# Patient Record
Sex: Female | Born: 1960 | Race: Black or African American | Hispanic: No | Marital: Single | Smoking: Former smoker
Health system: Southern US, Community
[De-identification: ages and names within clinical notes are randomized; demographics above are authoritative.]

## PROBLEM LIST (undated history)

## (undated) DIAGNOSIS — I10 Essential (primary) hypertension: Secondary | ICD-10-CM

## (undated) DIAGNOSIS — R569 Unspecified convulsions: Secondary | ICD-10-CM

## (undated) HISTORY — PX: GASTRIC BYPASS: SHX52

---

## 2009-11-23 DIAGNOSIS — R87619 Unspecified abnormal cytological findings in specimens from cervix uteri: Secondary | ICD-10-CM

## 2009-11-23 HISTORY — DX: Unspecified abnormal cytological findings in specimens from cervix uteri: R87.619

## 2011-12-02 DIAGNOSIS — K5790 Diverticulosis of intestine, part unspecified, without perforation or abscess without bleeding: Secondary | ICD-10-CM | POA: Insufficient documentation

## 2011-12-02 DIAGNOSIS — I8390 Asymptomatic varicose veins of unspecified lower extremity: Secondary | ICD-10-CM

## 2011-12-02 HISTORY — DX: Asymptomatic varicose veins of unspecified lower extremity: I83.90

## 2012-01-17 ENCOUNTER — Inpatient Hospital Stay (HOSPITAL_COMMUNITY): Payer: Self-pay

## 2012-01-17 ENCOUNTER — Observation Stay (HOSPITAL_COMMUNITY)
Admission: EM | Admit: 2012-01-17 | Discharge: 2012-01-18 | Disposition: A | Discharge status: Home / Self Care | Payer: Self-pay | Attending: Internal Medicine | Admitting: Internal Medicine

## 2012-01-17 ENCOUNTER — Encounter (HOSPITAL_COMMUNITY): Payer: Self-pay | Admitting: *Deleted

## 2012-01-17 ENCOUNTER — Emergency Department (HOSPITAL_COMMUNITY): Payer: Self-pay

## 2012-01-17 ENCOUNTER — Other Ambulatory Visit: Payer: Self-pay

## 2012-01-17 DIAGNOSIS — K219 Gastro-esophageal reflux disease without esophagitis: Secondary | ICD-10-CM | POA: Diagnosis present

## 2012-01-17 DIAGNOSIS — I10 Essential (primary) hypertension: Secondary | ICD-10-CM

## 2012-01-17 DIAGNOSIS — E785 Hyperlipidemia, unspecified: Secondary | ICD-10-CM | POA: Insufficient documentation

## 2012-01-17 DIAGNOSIS — F445 Conversion disorder with seizures or convulsions: Secondary | ICD-10-CM | POA: Diagnosis present

## 2012-01-17 DIAGNOSIS — R51 Headache: Secondary | ICD-10-CM | POA: Insufficient documentation

## 2012-01-17 DIAGNOSIS — IMO0002 Reserved for concepts with insufficient information to code with codable children: Secondary | ICD-10-CM | POA: Diagnosis present

## 2012-01-17 DIAGNOSIS — R059 Cough, unspecified: Secondary | ICD-10-CM | POA: Insufficient documentation

## 2012-01-17 DIAGNOSIS — E1169 Type 2 diabetes mellitus with other specified complication: Secondary | ICD-10-CM | POA: Diagnosis present

## 2012-01-17 DIAGNOSIS — J189 Pneumonia, unspecified organism: Secondary | ICD-10-CM | POA: Insufficient documentation

## 2012-01-17 DIAGNOSIS — R05 Cough: Secondary | ICD-10-CM | POA: Insufficient documentation

## 2012-01-17 DIAGNOSIS — Z9884 Bariatric surgery status: Secondary | ICD-10-CM

## 2012-01-17 DIAGNOSIS — R569 Unspecified convulsions: Principal | ICD-10-CM

## 2012-01-17 DIAGNOSIS — E119 Type 2 diabetes mellitus without complications: Secondary | ICD-10-CM | POA: Insufficient documentation

## 2012-01-17 DIAGNOSIS — R109 Unspecified abdominal pain: Secondary | ICD-10-CM | POA: Diagnosis present

## 2012-01-17 HISTORY — DX: Essential (primary) hypertension: I10

## 2012-01-17 HISTORY — DX: Bariatric surgery status: Z98.84

## 2012-01-17 HISTORY — DX: Unspecified convulsions: R56.9

## 2012-01-17 LAB — POCT I-STAT, CHEM 8
Calcium, Ion: 1.14 mmol/L (ref 1.12–1.32)
Chloride: 102 mEq/L (ref 96–112)
Glucose, Bld: 213 mg/dL — ABNORMAL HIGH (ref 70–99)
HCT: 35 % — ABNORMAL LOW (ref 36.0–46.0)
TCO2: 27 mmol/L (ref 0–100)

## 2012-01-17 LAB — DIFFERENTIAL
Basophils Absolute: 0 10*3/uL (ref 0.0–0.1)
Basophils Relative: 0 % (ref 0–1)
Eosinophils Relative: 1 % (ref 0–5)
Lymphocytes Relative: 26 % (ref 12–46)

## 2012-01-17 LAB — COMPREHENSIVE METABOLIC PANEL
ALT: 11 U/L (ref 0–35)
AST: 12 U/L (ref 0–37)
Albumin: 3 g/dL — ABNORMAL LOW (ref 3.5–5.2)
CO2: 25 mEq/L (ref 19–32)
Calcium: 8.8 mg/dL (ref 8.4–10.5)
Creatinine, Ser: 0.56 mg/dL (ref 0.50–1.10)
GFR calc non Af Amer: >90 mL/min (ref >90)
Sodium: 139 mEq/L (ref 135–145)
Total Protein: 6.5 g/dL (ref 6.0–8.3)

## 2012-01-17 LAB — URINE MICROSCOPIC-ADD ON

## 2012-01-17 LAB — RAPID URINE DRUG SCREEN, HOSP PERFORMED
Amphetamines: NOT DETECTED
Barbiturates: NOT DETECTED
Benzodiazepines: NOT DETECTED
Cocaine: NOT DETECTED
Opiates: NOT DETECTED
Tetrahydrocannabinol: NOT DETECTED

## 2012-01-17 LAB — CBC
HCT: 36.7 % (ref 36.0–46.0)
MCHC: 33.2 g/dL (ref 30.0–36.0)
MCHC: 32.4 g/dL (ref 30.0–36.0)
MCV: 84.5 fL (ref 78.0–100.0)
Platelets: 297 10*3/uL (ref 150–400)
Platelets: 295 10*3/uL (ref 150–400)
RDW: 13.6 % (ref 11.5–15.5)
RDW: 13.7 % (ref 11.5–15.5)
WBC: 7.6 10*3/uL (ref 4.0–10.5)
WBC: 7.1 10*3/uL (ref 4.0–10.5)

## 2012-01-17 LAB — URINALYSIS, ROUTINE W REFLEX MICROSCOPIC
Bilirubin Urine: NEGATIVE
Glucose, UA: 250 mg/dL — AB
Specific Gravity, Urine: 1.021 (ref 1.005–1.030)
pH: 6 (ref 5.0–8.0)

## 2012-01-17 LAB — BASIC METABOLIC PANEL
Chloride: 104 mEq/L (ref 96–112)
Creatinine, Ser: 0.51 mg/dL (ref 0.50–1.10)
GFR calc Af Amer: >90 mL/min (ref >90)
GFR calc non Af Amer: >90 mL/min (ref >90)
Potassium: 3.7 mEq/L (ref 3.5–5.1)

## 2012-01-17 LAB — POCT I-STAT TROPONIN I: Troponin i, poc: 0 ng/mL (ref 0.00–0.08)

## 2012-01-17 MED ORDER — DEXTROSE 5 % IV SOLN: 500.0000 mg | Freq: Once | INTRAVENOUS | Status: DC

## 2012-01-17 MED ORDER — ONDANSETRON HCL 4 MG PO TABS: 4.0000 mg | ORAL_TABLET | Freq: Four times a day (QID) | ORAL | Status: DC | PRN

## 2012-01-17 MED ORDER — MOXIFLOXACIN HCL IN NACL 400 MG/250ML IV SOLN
400.0000 mg | Freq: Once | INTRAVENOUS | Status: AC
  Administered 2012-01-17: 400 mg via INTRAVENOUS
  Filled 2012-01-17: qty 250

## 2012-01-17 MED ORDER — SODIUM CHLORIDE 0.9 % IV SOLN
INTRAVENOUS | Status: DC
  Administered 2012-01-17: 16:00:00 via INTRAVENOUS

## 2012-01-17 MED ORDER — MORPHINE SULFATE 2 MG/ML IJ SOLN: 1.0000 mg | INTRAMUSCULAR | Status: DC | PRN

## 2012-01-17 MED ORDER — ENOXAPARIN SODIUM 40 MG/0.4ML ~~LOC~~ SOLN
40.0000 mg | SUBCUTANEOUS | Status: DC
  Administered 2012-01-17: 40 mg via SUBCUTANEOUS
  Filled 2012-01-17 (×2): qty 0.4

## 2012-01-17 MED ORDER — SODIUM CHLORIDE 0.9 % IV SOLN
150.0000 mg | Freq: Three times a day (TID) | INTRAVENOUS | Status: DC
  Administered 2012-01-18: 150 mg via INTRAVENOUS
  Filled 2012-01-17 (×4): qty 3

## 2012-01-17 MED ORDER — PNEUMOCOCCAL VAC POLYVALENT 25 MCG/0.5ML IJ INJ
0.5000 mL | INJECTION | INTRAMUSCULAR | Status: DC
  Filled 2012-01-17: qty 0.5

## 2012-01-17 MED ORDER — ONDANSETRON HCL 4 MG/2ML IJ SOLN: 4.0000 mg | Freq: Four times a day (QID) | INTRAMUSCULAR | Status: DC | PRN

## 2012-01-17 MED ORDER — ACETAMINOPHEN 650 MG RE SUPP: 650.0000 mg | Freq: Four times a day (QID) | RECTAL | Status: DC | PRN

## 2012-01-17 MED ORDER — SODIUM CHLORIDE 0.9 % IV SOLN
150.0000 mg | Freq: Three times a day (TID) | INTRAVENOUS | Status: DC
  Filled 2012-01-17 (×2): qty 3

## 2012-01-17 MED ORDER — SODIUM CHLORIDE 0.9 % IV SOLN
1.5000 g | INTRAVENOUS | Status: AC
  Administered 2012-01-17: 1500 mg via INTRAVENOUS
  Filled 2012-01-17: qty 30

## 2012-01-17 MED ORDER — SODIUM CHLORIDE 0.9 % IJ SOLN: 3.0000 mL | Freq: Two times a day (BID) | INTRAMUSCULAR | Status: DC

## 2012-01-17 MED ORDER — PANTOPRAZOLE SODIUM 40 MG PO TBEC
40.0000 mg | DELAYED_RELEASE_TABLET | Freq: Every day | ORAL | Status: DC
  Administered 2012-01-18: 40 mg via ORAL
  Filled 2012-01-17: qty 1

## 2012-01-17 MED ORDER — LORAZEPAM 2 MG/ML IJ SOLN
INTRAMUSCULAR | Status: AC
  Administered 2012-01-17: 16:00:00
  Filled 2012-01-17: qty 1

## 2012-01-17 MED ORDER — INSULIN ASPART 100 UNIT/ML ~~LOC~~ SOLN
0.0000 [IU] | Freq: Three times a day (TID) | SUBCUTANEOUS | Status: DC
  Administered 2012-01-18: 3 [IU] via SUBCUTANEOUS
  Administered 2012-01-18: 2 [IU] via SUBCUTANEOUS
  Filled 2012-01-17: qty 3

## 2012-01-17 MED ORDER — DEXTROSE 5 % IV SOLN
1.0000 g | Freq: Once | INTRAVENOUS | Status: DC
  Filled 2012-01-17: qty 10

## 2012-01-17 MED ORDER — ACETAMINOPHEN 325 MG PO TABS: 650.0000 mg | ORAL_TABLET | Freq: Four times a day (QID) | ORAL | Status: DC | PRN

## 2012-01-17 MED ORDER — SIMVASTATIN 20 MG PO TABS
20.0000 mg | ORAL_TABLET | Freq: Every evening | ORAL | Status: DC
  Filled 2012-01-17: qty 1

## 2012-01-17 MED ORDER — SODIUM CHLORIDE 0.9 % IV SOLN
INTRAVENOUS | Status: AC
  Administered 2012-01-17: 23:00:00 via INTRAVENOUS

## 2012-01-17 NOTE — ED Provider Notes (Signed)
History     CSN: [id number]  Arrival date & time 01/17/12  1504   First MD Initiated Contact with Patient 01/17/12 1540      Chief Complaint  Patient presents with  . Seizures             (Consider location/radiation/quality/duration/timing/severity/associated sxs/prior treatment) HPI History provided by the patient's niece.  History the patient limited secondary to seizure.  51 year old female with remote history of seizures formally on Depakote and Dilantin presenting status post multiple seizures.  Patient's niece states that the patient was sitting down about one hour prior to ED arrival when she stated that she "felt like she was going to have a seizure." The patient sit up and then postured as if seizing, at which time the niece "bear-hugged" her and help her down.  No trauma.  Sz was with unresponsiveness and diffuse tonicclonic activity.  This lasted about 2 min with post-ictal decreasingly responsive.  Pt had 3 additional seizures prior to EMS arrival with similar character and duration.  No meds given by EMS.  EMS reporting hypertension with blood pressure 240/120 and tachycardia to 1:30. Glucose 201.  Patient reportedly with headache, per EMS, and abdominal pain today, per niece, without additional details known.  Pt then began seizing prior to ED bedding and at time of initial assessment.  Patient reportedly with her last seizure about 6 years ago.  She was taken off of Dilantin and Depakote, per niece.   Past Medical History  Diagnosis Date  . Diabetes mellitus   . Hypertension   . Seizures     Past Surgical History  Procedure Date  . Gastric bypass     one band has broke and was noted on her last colonoscopy    No family history on file.  History  Substance Use Topics  . Smoking status: Former Games developer  . Smokeless tobacco: Not on file  . Alcohol Use: No    OB History    Grav Para Term Preterm Abortions TAB SAB Ect Mult Living                  Review  of Systems  Unable to perform ROS: Mental status change  Constitutional: Negative for fever.  HENT: Negative for nosebleeds.   Respiratory: Negative for cough.   Gastrointestinal: Positive for abdominal pain. Negative for vomiting.  Musculoskeletal: Negative for gait problem.  Skin: Negative for wound.  Neurological: Positive for seizures and headaches.    Allergies  Aspirin; Demerol; and Penicillins  Home Medications   Current Outpatient Rx  Name Route Sig Dispense Refill  . CALCIUM CARBONATE-VITAMIN D 600-400 MG-UNIT PO TABS Oral Take 1 tablet by mouth 2 (two) times daily.    Marland Kitchen LISINOPRIL 5 MG PO TABS Oral Take 5 mg by mouth daily.    Marland Kitchen METFORMIN HCL 500 MG PO TABS Oral Take 500 mg by mouth 2 (two) times daily with a meal.    . OMEPRAZOLE 20 MG PO CPDR Oral Take 20 mg by mouth daily.    Marland Kitchen SIMVASTATIN 20 MG PO TABS Oral Take 20 mg by mouth every evening.    Marland Kitchen VITAMIN D (ERGOCALCIFEROL) 50000 UNITS PO CAPS Oral Take 50,000 Units by mouth every 7 (seven) days.      BP 140/70  Pulse 111  Temp(Src) 98.7 F (37.1 C) (Oral)  Resp 27  Ht 5\' 7"  (1.702 m)  Wt 275 lb (124.739 kg)  BMI 43.07 kg/m2  SpO2 99%  LMP 01/14/2012  Physical Exam  Nursing note and vitals reviewed. Constitutional:       Obese, actively seizing with a lower mild tonic activity. Seizure lasting about 1 minute, patient following simple commands within 3-4 minutes after the seizure as noted below. Mild upper airway noises. NRB placed  HENT:  Head: Normocephalic and atraumatic.  Right Ear: External ear normal.  Left Ear: External ear normal.  Nose: Nose normal.  Eyes: Conjunctivae and EOM are normal. Pupils are equal, round, and reactive to light.       Pupils 2-66mm and reactive  Neck: Normal range of motion. Neck supple.  Cardiovascular: Regular rhythm and intact distal pulses.  Tachycardia present.   No murmur heard. Pulmonary/Chest: Breath sounds normal. She is in respiratory distress (mildly tachypneic  without sig increased WOB). She has no wheezes. She has no rales.  Abdominal: Soft. Bowel sounds are normal. There is tenderness (mild, diffusely).       Midline scar, obese  Musculoskeletal: Normal range of motion. She exhibits no edema.  Neurological: She is unresponsive. She displays seizure activity.       Symmetric face, following command to squeeze bilateral UEs and move LEs to command about 3 min after termination of the sz; PERRL  Skin: Skin is warm and dry. No rash noted. She is not diaphoretic.    ED Course  Procedures (including critical care time)   Date: 01/17/2012  Rate:  110  Rhythm:  ST with occasional PACs  QRS Axis: normal  Intervals: normal  ST/T Wave abnormalities: normal  Conduction Disutrbances: none  Old EKG Reviewed:  No prior for comparison.   Labs Reviewed  CBC - Abnormal; Notable for the following:    Hemoglobin 11.6 (*)    HCT 34.9 (*)    All other components within normal limits  COMPREHENSIVE METABOLIC PANEL - Abnormal; Notable for the following:    Glucose, Bld 204 (*)    BUN 5 (*)    Albumin 3.0 (*)    Total Bilirubin 0.1 (*)    All other components within normal limits  URINALYSIS, ROUTINE W REFLEX MICROSCOPIC - Abnormal; Notable for the following:    Glucose, UA 250 (*)    Hgb urine dipstick TRACE (*)    All other components within normal limits  POCT I-STAT, CHEM 8 - Abnormal; Notable for the following:    BUN 4 (*)    Creatinine, Ser 0.40 (*)    Glucose, Bld 213 (*)    Hemoglobin 11.9 (*)    HCT 35.0 (*)    All other components within normal limits  URINE MICROSCOPIC-ADD ON - Abnormal; Notable for the following:    Squamous Epithelial / LPF FEW (*)    All other components within normal limits  DIFFERENTIAL  URINE RAPID DRUG SCREEN (HOSP PERFORMED)  POCT I-STAT TROPONIN I   Ct Head Wo Contrast  01/17/2012  *RADIOLOGY REPORT*  Clinical Data: Seizure disorder.  Seizure earlier today, now with headache.  CT HEAD WITHOUT CONTRAST  01/17/2012:  Technique:  Contiguous axial images were obtained from the base of the skull through the vertex without contrast.  Comparison: None.  Findings: Patient motion blurred some of the images. Ventricular system normal in size and appearance for age.  No mass lesion.  No midline shift.  No acute hemorrhage or hematoma.  No extra-axial fluid collections.  No evidence of acute infarction.  No focal brain parenchymal abnormalities.  No focal osseous abnormalities involving the skull.  Visualized paranasal sinuses, mastoid air  cells, and middle ear cavities well- aerated.  IMPRESSION: Normal examination.  Original Report Authenticated By: Arnell Sieving, M.D.   Dg Chest Port 1 View  01/17/2012  *RADIOLOGY REPORT*  Clinical Data: Chest pain.  Shortness of breath.  Headache. Hypertension.  Diabetes.  PORTABLE CHEST - 1 VIEW  Comparison: None.  Findings: Low lung volumes are present, causing crowding of the pulmonary vasculature.  Cardiac and mediastinal contours appear unremarkable.  Indistinct opacity at the right lung base may reflect pneumonia or atelectasis.  The left lung appears clear.  IMPRESSION:  1.  Indistinct airspace opacity at the right lung base is compatible with atelectasis or early bronchopneumonia. 2.  Low lung volumes.  Original Report Authenticated By: Dellia Cloud, M.D.     1. Seizure   2. Pneumonia       MDM  51 y.o. F with remote sz Hx, no longer on meds, presenting s/p 4 sz's with 1 additional tonicclonic episode soon after ED presentation.    Exam as above, AF, tachy, sz lasting no greater than 1 min with post-ictal state but recovering to baseline.  Labs with Nl UA, WBC, lytes, creat, and glu.  CXR c/f possible RLL consolidation/CAP; pt now admitting to ~3 wks of cough.  Pt with "allergy" to PCN (2/2 reportedly causes sz's); will Tx with Avalox with plan to monitor for repeat sz's.  Gen med consulted and will admit.       Particia Lather, MD 01/18/12 [phone]

## 2012-01-17 NOTE — ED Notes (Signed)
Pt has redness, tender and swelling to Left ant wrist from IV that was infusing Avelox.  IV removed and Paged md.  No respiratory issues and appears to be localized

## 2012-01-17 NOTE — ED Notes (Signed)
Pt started having full body convulsions and crying.  MD to bedside.  Ativan 2mg  IV given and applies 100%/NRB.  Pt now waking and opening eyes.  Pt vss.

## 2012-01-17 NOTE — H&P (Signed)
Hospital Admission Note Date: 01/17/2012  Patient name: Sierra Ellis Medical record number: [id number] Date of birth: Aug 16, 1961 Age: 52 y.o. Gender: female PCP: No primary provider on file.  Medical Service: Internal Medicine Teaching Service- Maurice March  Attending physician:  Doneen Poisson   1st Contact:   Kristie Cowman Pager: [phone] 2nd Contact:   Bard Herbert  Pager: [phone] After 5 pm or weekends: 1st Contact:      Pager: [phone] 2nd Contact:      Pager: [phone]  Chief Complaint: "seizure"  History of Present Illness: Pt presents to ED per EMS with reports of seizure activity x 4-5 episodes.  Per reports of patient and family at bedside, she awoke with a headache and abdominal pain.  She took Ibuprofen which helped to some extent.  Later on in the day she called her family to where she was in the home and stated that she was about to have a seizure.  It is reported that she then started to twitch and cry but would not respond to her family.  Her niece helped her to the ground and someone attempted to put a spoon in her mouth to prevent her from injurying her tongue.  By the time EMS arrived, it is documented per their records that she was conscious, having upper body tremors, crying, would not respond to anything less than painful stimuli and c/o chest pain at one point but denied chest pain after EKG. She did not lose control of bowels or bladder. Per the patient and the family, she has been experiencing headaches every 3 days for months which has been somewhat responsive to Ibuprofen 800 mg.  She states that she has had a non-productive cough for over 3 weeks that has not been associated with fever.  During exam by the internal medicine team she had an episode of tearfulness, upper extremity tremors, and hyperventilation.  She was responsive during this time and was able to be coaxed to stay alert, take deep breathes, and remain calm.  She reports that she had been on Dilantin and Depakote  beginning in her "20's" but stopped taking the medicines 6 years ago because she didn't like the way they made her feel. many years ago which was discontinued since she did not experience seizures.    Her PCP is Dr. Pollyann Kennedy of [place], [place]. Her past medical history is also significant for hyperlipidemia, diabetes, and hypertension of which she has recently started lisinopril, simvastatin, and metformin.  Meds: Medications Prior to Admission  Medication Dose Route Frequency Provider Last Rate Last Dose  . 0.9 %  sodium chloride infusion   Intravenous Continuous Juliet Rude. Rubin Payor, MD 125 mL/hr at 01/17/12 1557    . LORazepam (ATIVAN) 2 MG/ML injection           . moxifloxacin (AVELOX) IVPB 400 mg  400 mg Intravenous Once Particia Lather, MD   400 mg at 01/17/12 1801  . DISCONTD: azithromycin (ZITHROMAX) 500 mg in dextrose 5 % 250 mL IVPB  500 mg Intravenous Once Particia Lather, MD      . DISCONTD: cefTRIAXone (ROCEPHIN) 1 g in dextrose 5 % 50 mL IVPB  1 g Intravenous Once Particia Lather, MD       No current outpatient prescriptions on file as of 01/17/2012.    Allergies: Aspirin; Demerol; and Penicillins Past Medical History  Diagnosis Date  . Diabetes mellitus   . Hypertension   . Seizures    Past Surgical History  Procedure Date  .  Gastric bypass     one band has broke and was noted on her last colonoscopy   No family history on file. History   Social History  . Marital Status: Single    Spouse Name: N/A    Number of Children: N/A  . Years of Education: N/A   Occupational History  . Not on file.   Social History Main Topics  . Smoking status: Former Games developer  . Smokeless tobacco: Not on file  . Alcohol Use: No  . Drug Use: No  . Sexually Active:    Other Topics Concern  . Not on file   Social History Narrative  . No narrative on file    Review of Systems: As per HPI, in addition: Her family reports that one of the gastric bands from gastric bypass has apparently come off and  was seen per colonoscopy, denies anxiety or depression, denies substance abuse or alcohol use  Physical Exam: Blood pressure 121/64, pulse 106, temperature 98.7 F (37.1 C), temperature source Oral, resp. rate 18, height 5\' 7"  (1.702 m), weight 275 lb (124.739 kg), last menstrual period 01/14/2012, SpO2 97.00%. General: Vital signs reviewed and noted. Morbidly obese AA female, alert but minimally verbal  Head: Normocephalic, atraumatic. Eyes: PERRL, EOMI, No signs of anemia or jaundice. Throat: moist mucus membranes, oropharynx nonerythematous, no exudate appreciated, no trauma  Neck: No deformities, masses, or tenderness noted. Supple, No carotid Bruits, no JVD. Lungs: Normal respiratory effort. Clear to auscultation BL without crackles or wheezes. Heart: RRR. S1 and S2 normal without gallop, murmur, or rubs. Abdomen: Obese, BS normoactive, Soft,  Mildly tender to palpation in lower abdomen Extremities:1-2+ pedal edema, warm, distal pulses intact Neurologic:grossly intact, negative Babinski, alert and oriented DURING EPISODE: lasted ~ 3-4 minutes, eye squeezed closed, upper extremity rigors and tapping that waxed and waned, no incontinence, reoriented with verbal encouragement, remained alert    Lab results: Basic Metabolic Panel:  Basename 01/17/12 1626 01/17/12 1613  NA 140 139  K 3.6 3.7  CL 102 104  CO2 -- 25  GLUCOSE 213* 204*  BUN 4* 5*  CREATININE 0.40* 0.56  CALCIUM -- 8.8  MG -- --  PHOS -- --   Liver Function Tests:  Basename 01/17/12 1613  AST 12  ALT 11  ALKPHOS 100  BILITOT 0.1*  PROT 6.5  ALBUMIN 3.0*   No results found for this basename: LIPASE:2,AMYLASE:2 in the last 72 hours No results found for this basename: AMMONIA:2 in the last 72 hours CBC:  Basename 01/17/12 1626 01/17/12 1613  WBC -- 7.6  NEUTROABS -- 5.0  HGB 11.9* 11.6*  HCT 35.0* 34.9*  MCV -- 84.5  PLT -- 297   Cardiac Enzymes: No results found for this basename:  CKTOTAL:3,CKMB:3,CKMBINDEX:3,TROPONINI:3 in the last 72 hours BNP: No results found for this basename: PROBNP:3 in the last 72 hours D-Dimer: No results found for this basename: DDIMER:2 in the last 72 hours CBG: No results found for this basename: GLUCAP:6 in the last 72 hours Hemoglobin A1C: No results found for this basename: HGBA1C in the last 72 hours Fasting Lipid Panel: No results found for this basename: CHOL,HDL,LDLCALC,TRIG,CHOLHDL,LDLDIRECT in the last 72 hours Thyroid Function Tests: No results found for this basename: TSH,T4TOTAL,FREET4,T3FREE,THYROIDAB in the last 72 hours Anemia Panel: No results found for this basename: VITAMINB12,FOLATE,FERRITIN,TIBC,IRON,RETICCTPCT in the last 72 hours Coagulation: No results found for this basename: LABPROT:2,INR:2 in the last 72 hours Urine Drug Screen: Drugs of Abuse     Component Value  Date/Time   LABOPIA NONE DETECTED 01/17/2012 1642   COCAINSCRNUR NONE DETECTED 01/17/2012 1642   LABBENZ NONE DETECTED 01/17/2012 1642   AMPHETMU NONE DETECTED 01/17/2012 1642   THCU NONE DETECTED 01/17/2012 1642   LABBARB NONE DETECTED 01/17/2012 1642    Alcohol Level: No results found for this basename: ETH:2 in the last 72 hours Urinalysis:  Basename 01/17/12 1641  COLORURINE YELLOW  LABSPEC 1.021  PHURINE 6.0  GLUCOSEU 250*  HGBUR TRACE*  BILIRUBINUR NEGATIVE  KETONESUR NEGATIVE  PROTEINUR NEGATIVE  UROBILINOGEN 1.0  NITRITE NEGATIVE  LEUKOCYTESUR NEGATIVE    Imaging results:  Ct Head Wo Contrast  01/17/2012  *RADIOLOGY REPORT*  Clinical Data: Seizure disorder.  Seizure earlier today, now with headache.  CT HEAD WITHOUT CONTRAST 01/17/2012:  Technique:  Contiguous axial images were obtained from the base of the skull through the vertex without contrast.  Comparison: None.  Findings: Patient motion blurred some of the images. Ventricular system normal in size and appearance for age.  No mass lesion.  No midline shift.  No acute  hemorrhage or hematoma.  No extra-axial fluid collections.  No evidence of acute infarction.  No focal brain parenchymal abnormalities.  No focal osseous abnormalities involving the skull.  Visualized paranasal sinuses, mastoid air cells, and middle ear cavities well- aerated.  IMPRESSION: Normal examination.  Original Report Authenticated By: Arnell Sieving, M.D.   Dg Chest Port 1 View  01/17/2012  *RADIOLOGY REPORT*  Clinical Data: Chest pain.  Shortness of breath.  Headache. Hypertension.  Diabetes.  PORTABLE CHEST - 1 VIEW  Comparison: None.  Findings: Low lung volumes are present, causing crowding of the pulmonary vasculature.  Cardiac and mediastinal contours appear unremarkable.  Indistinct opacity at the right lung base may reflect pneumonia or atelectasis.  The left lung appears clear.  IMPRESSION:  1.  Indistinct airspace opacity at the right lung base is compatible with atelectasis or early bronchopneumonia. 2.  Low lung volumes.  Original Report Authenticated By: Dellia Cloud, M.D.    Other results: EKG: sinus tachycardia at 110 bpm, no ST changes  Assessment & Plan by Problem: #1. 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  Epileptic seizures are high on the differential given her reported history of epilepsy controlled with anti-convulsants.  UDS was negative, not hypoglycemic, no electrolyte abnormality, and CT without acute findings. PLAN -will consider loading Depakote as she may have concurrent epilepsy -will attempt to address any emotional and social stressors -Ativan prn -check TSH  #2. Cough: Likely ACE-I induced cough. Will r/o pneumonia given CXR on admission demonstrates indistinct airspace opacity at right lung base c/w atelectasis or early  bronchopneumonia PLAN -check 2 view CXR -hold lisinopril  #3 Hypertension: stable, will continue to monitor PLAN -hold lisinopril  #4 Hyperlipidemia: will continue statin therapy PLAN -check lipid panel  #5 Diabetes Mellitus: reports recently starting metformin PLAN -will check HgbA1c -hold metformin and cover with SSI   Signed: Sadiya Durand 01/17/2012, 6:21 PM

## 2012-01-17 NOTE — ED Notes (Signed)
Ems called out by family and they described grandmal like seizure symptoms and upon ems arrival pt was having body tremors, crying, looked around and responded to sternal rub.  Pt has history of seizures and last one was six years ago and pt not on any medications for sz presently.  BP originally 240/120 and HR 130 and then 170/100 with HR 112.  CBG- 201.  Pt complains of 9/10 headache frontal with sudden onset.  Pt cannot tell [place] birthday or age or year.  Pt knows her name.  No extremity of facial deficits.  Pt reports intermittent chest and abdominal pain

## 2012-01-17 NOTE — Consult Note (Signed)
MEDICATION RELATED CONSULT NOTE - INITIAL   Pharmacy Consult for Phenytoin Indication: Seizures  Allergies  Allergen Reactions  . Aspirin Other (See Comments)    Seizures.   . Demerol Other (See Comments)    Seizures.   . Penicillins Other (See Comments)    Seizures.     Patient Measurements: Height: 5\' 7"  (170.2 cm) Weight: 275 lb (124.739 kg) IBW/kg (Calculated) : 61.6  Adjusted Body Weight: 86.8kg  Vital Signs: Temp: 98.7 F (37.1 C) (02/24 1508) Temp src: Oral (02/24 1508) BP: 121/64 mmHg (02/24 1803) Pulse Rate: 106  (02/24 1803) Intake/Output from previous day:   Intake/Output from this shift: Total I/O In: 500 [I.V.:500] Out: -   Labs:  Basename 01/17/12 1626 01/17/12 1613  WBC -- 7.6  HGB 11.9* 11.6*  HCT 35.0* 34.9*  PLT -- 297  APTT -- --  CREATININE 0.40* 0.56  LABCREA -- --  CREATININE 0.40* 0.56  CREAT24HRUR -- --  MG -- --  PHOS -- --  ALBUMIN -- 3.0*  PROT -- 6.5  ALBUMIN -- 3.0*  AST -- 12  ALT -- 11  ALKPHOS -- 100  BILITOT -- 0.1*  BILIDIR -- --  IBILI -- --   Estimated Creatinine Clearance: 116.6 ml/min (by C-G formula based on Cr of 0.4).   Microbiology: No results found for this or any previous visit (from the past 720 hour(s)).  Medical History: Past Medical History  Diagnosis Date  . Diabetes mellitus   . Hypertension   . Seizures     Medications:  No seizure meds pta  Assessment: 50yof to begin phenytoin for new seizures. Liver/renal function are wnl. Noted albumin of 3.0. Will use adjusted body weight for dosing as patient is >120% of her ideal body weight.  Goal of Therapy:  Total phenytoin level 10-20 Free phenytoin level 1-2  Plan:  1) Phenytoin 1.5g IV x 1 for loading dose 2) Phenytoin 150mg  IV q8 3) Check levels at steady state  Fredrik Rigger 01/17/2012,6:25 PM

## 2012-01-18 DIAGNOSIS — R569 Unspecified convulsions: Secondary | ICD-10-CM | POA: Diagnosis present

## 2012-01-18 LAB — GLUCOSE, CAPILLARY
Glucose-Capillary: 183 mg/dL — ABNORMAL HIGH (ref 70–99)
Glucose-Capillary: 217 mg/dL — ABNORMAL HIGH (ref 70–99)

## 2012-01-18 LAB — BASIC METABOLIC PANEL
BUN: 5 mg/dL — ABNORMAL LOW (ref 6–23)
Calcium: 8.8 mg/dL (ref 8.4–10.5)
Chloride: 106 mEq/L (ref 96–112)
Creatinine, Ser: 0.53 mg/dL (ref 0.50–1.10)
GFR calc Af Amer: >90 mL/min (ref >90)
GFR calc non Af Amer: >90 mL/min (ref >90)

## 2012-01-18 LAB — HEMOGLOBIN A1C
Hgb A1c MFr Bld: 8.2 % — ABNORMAL HIGH (ref <5.7)
Mean Plasma Glucose: 189 mg/dL — ABNORMAL HIGH (ref <117)

## 2012-01-18 LAB — LIPID PANEL: Cholesterol: 167 mg/dL (ref 0–200)

## 2012-01-18 LAB — TSH: TSH: 2.342 u[IU]/mL (ref 0.350–4.500)

## 2012-01-18 MED ORDER — LIVING WELL WITH DIABETES BOOK
Freq: Once | Status: AC
  Administered 2012-01-18: 12:00:00
  Filled 2012-01-18: qty 1

## 2012-01-18 MED ORDER — METFORMIN HCL 500 MG PO TABS
500.0000 mg | ORAL_TABLET | Freq: Every day | ORAL | Status: DC
  Filled 2012-01-18: qty 1

## 2012-01-18 MED ORDER — METFORMIN HCL 1000 MG PO TABS: 1000.0000 mg | ORAL_TABLET | Freq: Two times a day (BID) | ORAL | Status: DC

## 2012-01-18 MED ORDER — METFORMIN HCL 1000 MG PO TABS: 1000.0000 mg | ORAL_TABLET | Freq: Every day | ORAL | Status: DC

## 2012-01-18 MED ORDER — METFORMIN HCL 500 MG PO TABS: 1000.0000 mg | ORAL_TABLET | Freq: Two times a day (BID) | ORAL | Status: DC

## 2012-01-18 MED ORDER — METFORMIN HCL 500 MG PO TABS: 500.0000 mg | ORAL_TABLET | Freq: Every day | ORAL | Status: DC

## 2012-01-18 MED ORDER — METFORMIN HCL 500 MG PO TABS: 1000.0000 mg | ORAL_TABLET | Freq: Every day | ORAL | Status: DC

## 2012-01-18 MED ORDER — TRAZODONE HCL 50 MG PO TABS
50.0000 mg | ORAL_TABLET | Freq: Every day | ORAL | Status: DC
  Filled 2012-01-18: qty 1

## 2012-01-18 MED ORDER — METFORMIN HCL 500 MG PO TABS
1000.0000 mg | ORAL_TABLET | Freq: Every day | ORAL | Status: DC
  Filled 2012-01-18: qty 2

## 2012-01-18 MED ORDER — LOSARTAN POTASSIUM 50 MG PO TABS
50.0000 mg | ORAL_TABLET | Freq: Every day | ORAL | Status: DC
  Administered 2012-01-18: 50 mg via ORAL
  Filled 2012-01-18: qty 1

## 2012-01-18 MED ORDER — METFORMIN HCL 500 MG PO TABS: 500.0000 mg | ORAL_TABLET | Freq: Two times a day (BID) | ORAL | Status: DC

## 2012-01-18 MED ORDER — METFORMIN HCL 500 MG PO TABS
1000.0000 mg | ORAL_TABLET | Freq: Two times a day (BID) | ORAL | Status: DC
  Administered 2012-01-18: 1000 mg via ORAL

## 2012-01-18 MED ORDER — METFORMIN HCL 500 MG PO TABS
1000.0000 mg | ORAL_TABLET | Freq: Every day | ORAL | Status: DC
  Filled 2012-01-18 (×2): qty 2

## 2012-01-18 MED ORDER — TRAZODONE HCL 50 MG PO TABS: 50.0000 mg | ORAL_TABLET | Freq: Every day | ORAL | Status: DC

## 2012-01-18 MED ORDER — LOSARTAN POTASSIUM 50 MG PO TABS: 50.0000 mg | ORAL_TABLET | Freq: Every day | ORAL | Status: DC

## 2012-01-18 NOTE — H&P (Signed)
Internal Medicine Attending Admission Note Date: 01/18/2012  Patient name: Sierra Ellis Medical record number: [id number] Date of birth: 09-22-1961 Age: 51 y.o. Gender: female  I saw and evaluated the patient. I reviewed the resident's note and I agree with the resident's findings and plan as documented in the resident's note.  Chief Complaint(s):  seizures  History - key components related to admission:  Sierra Ellis is a 51 year old woman with a history of diabetes, hypertension, and hyperlipidemia who presents with the acute onset of what is described as seizures. She apparently felt she was going to have a seizure yesterday and contacted her family via phone to let them know. She was brought to the emergency department for further evaluation after having such an event. Fortunately, in the emergency department she had another event in front of the internal medicine teaching service. This was characterized as bilateral upper extremity shaking and tapping in association with being alert, crying, and following commands. She was calmed down with encouragement and the episode stopped.  This morning, when most of the family was out of the room, further history was taken. Apparently she has had some perimenopausal symptoms including vasomotor and irritability. She has had tremendous difficulty getting to sleep and remains fatigued at all times.  Physical Exam - key components related to admission:  Filed Vitals:   01/17/12 1937 01/17/12 1941 01/17/12 2011 01/18/12 0602  BP:  115/71 119/78 130/79  Pulse: 97  90 91  Temp:   98.4 F (36.9 C) 98 F (36.7 C)  TempSrc:   Oral Oral  Resp: 18  20 20   Height:      Weight:      SpO2: 97%  95% 98%   General: Well-developed, well-nourished, obese woman lying comfortably in bed in no acute distress. Lungs: Clear to auscultation without wheezes, rhonchi, or rales. Heart: Regular rate and rhythm without murmurs, rubs, or gallops. Abdomen: Soft, non  tender, active bowel sounds. Extremities: Without edema. Neuro: Strength 5 over 5 throughout, sensation grossly intact throughout, cranial nerves II through XII intact.  Lab results:  Basic Metabolic Panel:  Basename 01/18/12 0553 01/17/12 2212  NA 140 139  K 3.8 3.7  CL 106 104  CO2 26 23  GLUCOSE 183* 166*  BUN 5* 4*  CREATININE 0.53 0.51  CALCIUM 8.8 8.9  MG -- --  PHOS -- --   Liver Function Tests:  [facility] 01/17/12 1613  AST 12  ALT 11  ALKPHOS 100  BILITOT 0.1*  PROT 6.5  ALBUMIN 3.0*   CBC:  Basename 01/17/12 2212 01/17/12 1626 01/17/12 1613  WBC 7.1 -- 7.6  NEUTROABS -- -- 5.0  HGB 11.9* 11.9* --  HCT 36.7 35.0* --  MCV 85.0 -- 84.5  PLT 295 -- 297   CBG:  Basename 01/18/12 0741  GLUCAP 183*   Hemoglobin A1C:  Basename 01/17/12 2212  HGBA1C 8.2*   Fasting Lipid Panel:  Basename 01/18/12 0553  CHOL 167  HDL 48  LDLCALC 91  TRIG 138  CHOLHDL 3.5  LDLDIRECT --   Thyroid Function Tests:  Basename 01/17/12 2212  TSH 2.342  T4TOTAL --  FREET4 --  T3FREE --  THYROIDAB --   Urine Drug Screen:  Negative  Urinalysis:  Unremarkable  Imaging results:  Dg Chest 2 View  01/18/2012  *RADIOLOGY REPORT*  Clinical Data: Question of right lower lobe pneumonia on the prior chest radiograph.  Request further assessment with PA and lateral films.  CHEST - 2 VIEW  Comparison:  Chest radiograph performed earlier today at 04:05 p.m.  Findings: The previously noted right basilar airspace opacity has resolved, and likely reflected atelectasis.  The lungs are relatively well-aerated and clear.  There is no evidence of focal opacification, pleural effusion or pneumothorax.  The cardiomediastinal silhouette is within normal limits.  No acute osseous abnormalities are seen.  IMPRESSION: Interval resolution of right basilar opacity; this likely reflected atelectasis on the prior study.  No acute cardiopulmonary process seen.  Original Report Authenticated By:  Tonia Ghent, M.D.   Dg Abd 1 View  01/17/2012  *RADIOLOGY REPORT*  Clinical Data: Nausea and vomiting; abdominal pain.  History of gastric bypass.  ABDOMEN - 1 VIEW  Comparison: None.  Findings: The visualized bowel gas pattern is unremarkable. Scattered air and stool filled loops of colon are seen; no abnormal dilatation of small bowel loops is seen to suggest small bowel obstruction.  No free intra-abdominal air is identified, though evaluation for free air is limited on a single supine view. Postoperative changes are noted at the left upper quadrant.  A trace amount of air is noted within the distal stomach.  The visualized osseous structures are within normal limits; the sacroiliac joints are unremarkable in appearance.  IMPRESSION: Unremarkable bowel gas pattern; no free intra-abdominal air seen.  Original Report Authenticated By: Tonia Ghent, M.D.   Ct Head Wo Contrast  01/17/2012  *RADIOLOGY REPORT*  Clinical Data: Seizure disorder.  Seizure earlier today, now with headache.  CT HEAD WITHOUT CONTRAST 01/17/2012:  Technique:  Contiguous axial images were obtained from the base of the skull through the vertex without contrast.  Comparison: None.  Findings: Patient motion blurred some of the images. Ventricular system normal in size and appearance for age.  No mass lesion.  No midline shift.  No acute hemorrhage or hematoma.  No extra-axial fluid collections.  No evidence of acute infarction.  No focal brain parenchymal abnormalities.  No focal osseous abnormalities involving the skull.  Visualized paranasal sinuses, mastoid air cells, and middle ear cavities well- aerated.  IMPRESSION: Normal examination.  Original Report Authenticated By: Arnell Sieving, M.D.   Dg Chest Port 1 View  01/17/2012  *RADIOLOGY REPORT*  Clinical Data: Chest pain.  Shortness of breath.  Headache. Hypertension.  Diabetes.  PORTABLE CHEST - 1 VIEW  Comparison: None.  Findings: Low lung volumes are present, causing  crowding of the pulmonary vasculature.  Cardiac and mediastinal contours appear unremarkable.  Indistinct opacity at the right lung base may reflect pneumonia or atelectasis.  The left lung appears clear.  IMPRESSION:  1.  Indistinct airspace opacity at the right lung base is compatible with atelectasis or early bronchopneumonia. 2.  Low lung volumes.  Original Report Authenticated By: Dellia Cloud, M.D.   Other results:  EKG: normal sinus tachycardia at 100 beats per minute, normal axis and intervals, no ST-T changes. No comparisons available.  Assessment & Plan by Problem:  Ms. Romeo Apple is a 51 year old woman with diabetes, hypertension, and hyperlipidemia who presents with psychogenic epileptic seizures. It appears that these psychogenic seizures are related to stress associated with her perimenopausal state and symptoms as well as insomnia. Rather than treat epileptic seizures with antiepileptic medications we will focus our efforts on the precipitating factors of this psychogenic epileptic seizure.  1) Psychogenic epileptic seizure: I agree with the housestaff's plan to start trazodone at night and titrate up to improve her insomnia. If the vasomotor symptoms associated with her perimenopausal state continue to be problematic one  can try an SSRI in hopes of blunting the symptoms and treat any concomitant depression she may have.  2) Disposition: I agree with the plan to discharge Ms. Diop home today with follow up in her primary care provider's office.

## 2012-01-18 NOTE — Discharge Summary (Signed)
Internal Medicine Teaching [facility] Discharge Note  Name: Sierra Ellis MRN: [id number] DOB: December 20, 1960 51 y.o.  Date of Admission: 01/17/2012  3:04 PM Date of Discharge: 01/18/2012 Attending Physician: Rocco Serene, MD  Discharge Diagnosis: Principal Problem:  *Pseudoseizures Active Problems:  Diabetes mellitus  Hypertension  Hyperlipidemia  GERD (gastroesophageal reflux disease)  Abdominal pain  Status post gastric bypass for obesity  Nonepileptic episode   Discharge Medications: Medication List  As of 01/18/2012 12:26 PM   STOP taking these medications         lisinopril 5 MG tablet         TAKE these medications         Calcium 600+D 600-400 MG-UNIT per tablet   Generic drug: Calcium Carbonate-Vitamin D   Take 1 tablet by mouth 2 (two) times daily.      losartan 50 MG tablet   Commonly known as: COZAAR   Take 1 tablet (50 mg total) by mouth daily.      metFORMIN 1000 MG tablet   Commonly known as: GLUCOPHAGE   Take 1 tablet (1,000 mg total) by mouth daily with breakfast.      metFORMIN 500 MG tablet   Commonly known as: GLUCOPHAGE   Take 1 tablet (500 mg total) by mouth daily with supper.      metFORMIN 1000 MG tablet   Commonly known as: GLUCOPHAGE   Take 1 tablet (1,000 mg total) by mouth 2 (two) times daily with a meal.      omeprazole 20 MG capsule   Commonly known as: PRILOSEC   Take 20 mg by mouth daily.      simvastatin 20 MG tablet   Commonly known as: ZOCOR   Take 20 mg by mouth every evening.      traZODone 50 MG tablet   Commonly known as: DESYREL   Take 1 tablet (50 mg total) by mouth at bedtime.      Vitamin D (Ergocalciferol) 50000 UNITS Caps   Commonly known as: DRISDOL   Take 50,000 Units by mouth every 7 (seven) days.            Disposition and follow-up:   Sierra Ellis was discharged from [facility] in Stable condition.    Follow-up Appointments: Follow-up Information    Follow up with  Pcp Not In System. (Friday, 01/22/2012 at [facility], Dr. Pollyann Kennedy [phone])         Discharge Orders    Future Orders Please Complete By Expires   Diet - low sodium heart healthy      Increase activity slowly      Discharge instructions      Comments:   Follow-up with Dr. Pollyann Kennedy on Friday, 01/22/2012.  Discuss change in metformin to help with blood sugar control.  Your hemoglobin A1c is >8%.  Take the Trazodone to help with sleep. Discuss ways to help decrease stress which is likely triggering your episodes. It may be useful to start an SSRI medication which will help with your mood.      Consultations: None    Procedures Performed:  Dg Chest 2 View  01/18/2012  *RADIOLOGY REPORT*  Clinical Data: Question of right lower lobe pneumonia on the prior chest radiograph.  Request further assessment with PA and lateral films.  CHEST - 2 VIEW  Comparison: Chest radiograph performed earlier today at 04:05 p.m.  Findings: The previously noted right basilar airspace opacity has resolved, and likely reflected atelectasis.  The  lungs are relatively well-aerated and clear.  There is no evidence of focal opacification, pleural effusion or pneumothorax.  The cardiomediastinal silhouette is within normal limits.  No acute osseous abnormalities are seen.  IMPRESSION: Interval resolution of right basilar opacity; this likely reflected atelectasis on the prior study.  No acute cardiopulmonary process seen.  Original Report Authenticated By: Tonia Ghent, M.D.   Dg Abd 1 View  01/17/2012  *RADIOLOGY REPORT*  Clinical Data: Nausea and vomiting; abdominal pain.  History of gastric bypass.  ABDOMEN - 1 VIEW  Comparison: None.  Findings: The visualized bowel gas pattern is unremarkable. Scattered air and stool filled loops of colon are seen; no abnormal dilatation of small bowel loops is seen to suggest small bowel obstruction.  No free intra-abdominal air is identified, though evaluation for free air  is limited on a single supine view. Postoperative changes are noted at the left upper quadrant.  A trace amount of air is noted within the distal stomach.  The visualized osseous structures are within normal limits; the sacroiliac joints are unremarkable in appearance.  IMPRESSION: Unremarkable bowel gas pattern; no free intra-abdominal air seen.  Original Report Authenticated By: Tonia Ghent, M.D.   Ct Head Wo Contrast  01/17/2012  *RADIOLOGY REPORT*  Clinical Data: Seizure disorder.  Seizure earlier today, now with headache.  CT HEAD WITHOUT CONTRAST 01/17/2012:  Technique:  Contiguous axial images were obtained from the base of the skull through the vertex without contrast.  Comparison: None.  Findings: Patient motion blurred some of the images. Ventricular system normal in size and appearance for age.  No mass lesion.  No midline shift.  No acute hemorrhage or hematoma.  No extra-axial fluid collections.  No evidence of acute infarction.  No focal brain parenchymal abnormalities.  No focal osseous abnormalities involving the skull.  Visualized paranasal sinuses, mastoid air cells, and middle ear cavities well- aerated.  IMPRESSION: Normal examination.  Original Report Authenticated By: Arnell Sieving, M.D.   Dg Chest Port 1 View  01/17/2012  *RADIOLOGY REPORT*  Clinical Data: Chest pain.  Shortness of breath.  Headache. Hypertension.  Diabetes.  PORTABLE CHEST - 1 VIEW  Comparison: None.  Findings: Low lung volumes are present, causing crowding of the pulmonary vasculature.  Cardiac and mediastinal contours appear unremarkable.  Indistinct opacity at the right lung base may reflect pneumonia or atelectasis.  The left lung appears clear.  IMPRESSION:  1.  Indistinct airspace opacity at the right lung base is compatible with atelectasis or early bronchopneumonia. 2.  Low lung volumes.  Original Report Authenticated By: Dellia Cloud, M.D.    Admission HPI: Pt presents to ED per EMS with  reports of seizure activity x 4-5 episodes. Per reports of patient and family at bedside, she awoke with a headache and abdominal pain. She took Ibuprofen which helped to some extent. Later on in the day she called her family to where she was in the home and stated that she was about to have a seizure. It is reported that she then started to twitch and cry but would not respond to her family. Her niece helped her to the ground and someone attempted to put a spoon in her mouth to prevent her from injurying her tongue. By the time EMS arrived, it is documented per their records that she was conscious, having upper body tremors, crying, would not respond to anything less than painful stimuli and c/o chest pain at one point but denied chest pain after EKG.  She did not lose control of bowels or bladder.  Per the patient and the family, she has been experiencing headaches every 3 days for months which has been somewhat responsive to Ibuprofen 800 mg. She states that she has had a non-productive cough for over 3 weeks that has not been associated with fever. During exam by the internal medicine team she had an episode of tearfulness, upper extremity tremors, and hyperventilation. She was responsive during this time and was able to be coaxed to stay alert, take deep breathes, and remain calm. She reports that she had been on Dilantin and Depakote beginning in her "20's" but stopped taking the medicines 6 years ago because she didn't like the way they made her feel. many years ago which was discontinued since she did not experience seizures.  Her PCP is Dr. Pollyann Kennedy of [place], [place]. Her past medical history is also significant for hyperlipidemia, diabetes, and hypertension of which she has recently started lisinopril, simvastatin, and metformin.  Admission Physical Exam:  Blood pressure 121/64, pulse 106, temperature 98.7 F (37.1 C), temperature source Oral, resp. rate 18, height 5\' 7"  (1.702 m), weight 275 lb  (124.739 kg), last menstrual period 01/14/2012, SpO2 97.00%. General: Vital signs reviewed and noted. Morbidly obese AA female, alert but minimally verbal  Head: Normocephalic, atraumatic. Eyes: PERRL, EOMI, No signs of anemia or jaundice. Throat: moist mucus membranes, oropharynx nonerythematous, no exudate appreciated, no trauma  Neck: No deformities, masses, or tenderness noted. Supple, No carotid Bruits, no JVD. Lungs: Normal respiratory effort. Clear to auscultation BL without crackles or wheezes. Heart: RRR. S1 and S2 normal without gallop, murmur, or rubs. Abdomen: Obese, BS normoactive, Soft, Mildly tender to palpation in lower abdomen Extremities:1-2+ pedal edema, warm, distal pulses intact Neurologic:grossly intact, negative Babinski, alert and oriented  DURING EPISODE: lasted ~ 3-4 minutes, eye squeezed closed, upper extremity rigors and tapping that waxed and waned, no incontinence, reoriented with verbal encouragement, remained alert   Hospital Course by problem list: #1. 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 Epileptic seizures were high on the differential given her reported history of epilepsy controlled with anti-convulsants. Urine drug screen was negative.  She was not hypoglycemic,and without electrolyte abnormalities.  In addition,  CT of the head was without acute findings. Her PCP should follow-up with any emotional and social stressors that may be triggering these episodes.  She was discharged on Trazodone 50 mg qhs and educated as to the benefit of counseling to help deal with her stress level.  In addition the likely need to start a SSRI per her PCP for her reported irritability, sleep disturbance, and racing thoughts was also addressed.  #2. Cough: CXR on admission  demonstrated indistinct airspace opacity at right lung base c/w atelectasis or early bronchopneumonia.  Pneumonia was less likely given that the patient remained afebrile without complaints of shortness of breath, chest pain or need for supplemental oxygen by day of discharge.  Her cough was likely a side effect of ACE-I. She was discharged on losartan 50 mg daily and should be reassessed within 4-6 weeks for resolution of symptoms.  #3 Hypertension: remained stable  #4 Hyperlipidemia: Goal LDL is <100. She was continued on statin therapy of simvastatin 20 mg daily given that her Lipid Panel demonstrated LDL of 91.  #5 Diabetes Mellitus: Capillary blood glucose  ranged 183-217 during her stay.  Her hemoglobin A1c of 8.2 demonstrated poor glycemic control thus she was discharged with a metformin titration towards 1000 mg bid.  She was admitted and had been on Metformin 500 mg bid. She was advised to start metformin 1000 mg in the morning and 500 mg in the evening for 7 days followed by increase to 1000 mg bid.  This should increase gastrointestinal tolerance and hopefully better glycemic control.  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    Discharge Vitals:  BP 130/79  Pulse 91  Temp(Src) 98 F (36.7 C) (Oral)  Resp 20  Ht 5\' 7"  (1.702 m)  Wt 275 lb (124.739 kg)  BMI 43.07 kg/m2  SpO2 98%  LMP 01/14/2012  Discharge Labs:  Results for orders placed during the hospital encounter of 01/17/12 (from the past 24 hour(s))  CBC     Status: Abnormal   Collection Time   01/17/12  4:13 PM      Component Value Range   WBC 7.6  4.0 - 10.5 (K/uL)   RBC 4.13  3.87 - 5.11 (MIL/uL)   Hemoglobin 11.6 (*) 12.0 - 15.0 (g/dL)   HCT 08.6 (*) 57.8 - 46.0 (%)   MCV 84.5  78.0 - 100.0 (fL)   MCH 28.1  26.0 - 34.0 (pg)   MCHC 33.2  30.0 - 36.0 (g/dL)   RDW 46.9  62.9 - 52.8  (%)   Platelets 297  150 - 400 (K/uL)  DIFFERENTIAL     Status: Normal   Collection Time   01/17/12  4:13 PM      Component Value Range   Neutrophils Relative 66  43 - 77 (%)   Neutro Abs 5.0  1.7 - 7.7 (K/uL)   Lymphocytes Relative 26  12 - 46 (%)   Lymphs Abs 2.0  0.7 - 4.0 (K/uL)   Monocytes Relative 7  3 - 12 (%)   Monocytes Absolute 0.5  0.1 - 1.0 (K/uL)   Eosinophils Relative 1  0 - 5 (%)   Eosinophils Absolute 0.1  0.0 - 0.7 (K/uL)   Basophils Relative 0  0 - 1 (%)   Basophils Absolute 0.0  0.0 - 0.1 (K/uL)  COMPREHENSIVE METABOLIC PANEL     Status: Abnormal   Collection Time   01/17/12  4:13 PM      Component Value Range   Sodium 139  135 - 145 (mEq/L)   Potassium 3.7  3.5 - 5.1 (mEq/L)   Chloride 104  96 - 112 (mEq/L)   CO2 25  19 - 32 (mEq/L)   Glucose, Bld 204 (*) 70 - 99 (mg/dL)   BUN 5 (*) 6 - 23 (mg/dL)   Creatinine, Ser 4.13  0.50 - 1.10 (mg/dL)   Calcium 8.8  8.4 - 24.4 (mg/dL)   Total Protein 6.5  6.0 - 8.3 (g/dL)   Albumin 3.0 (*) 3.5 - 5.2 (g/dL)   AST 12  0 - 37 (U/L)   ALT 11  0 - 35 (U/L)   Alkaline Phosphatase 100  39 - 117 (U/L)   Total Bilirubin 0.1 (*) 0.3 - 1.2 (mg/dL)   GFR calc non Af Amer >90  >90 (mL/min)   GFR calc Af Amer >90  >90 (mL/min)  POCT I-STAT TROPONIN I     Status: Normal   Collection Time  01/17/12  4:24 PM      Component Value Range   Troponin i, poc 0.00  0.00 - 0.08 (ng/mL)   Comment 3           POCT I-STAT, CHEM 8     Status: Abnormal   Collection Time   01/17/12  4:26 PM      Component Value Range   Sodium 140  135 - 145 (mEq/L)   Potassium 3.6  3.5 - 5.1 (mEq/L)   Chloride 102  96 - 112 (mEq/L)   BUN 4 (*) 6 - 23 (mg/dL)   Creatinine, Ser 1.61 (*) 0.50 - 1.10 (mg/dL)   Glucose, Bld 096 (*) 70 - 99 (mg/dL)   Calcium, Ion 0.45  4.09 - 1.32 (mmol/L)   TCO2 27  0 - 100 (mmol/L)   Hemoglobin 11.9 (*) 12.0 - 15.0 (g/dL)   HCT 81.1 (*) 91.4 - 46.0 (%)  URINALYSIS, ROUTINE W REFLEX MICROSCOPIC     Status: Abnormal    Collection Time   01/17/12  4:41 PM      Component Value Range   Color, Urine YELLOW  YELLOW    APPearance CLEAR  CLEAR    Specific Gravity, Urine 1.021  1.005 - 1.030    pH 6.0  5.0 - 8.0    Glucose, UA 250 (*) NEGATIVE (mg/dL)   Hgb urine dipstick TRACE (*) NEGATIVE    Bilirubin Urine NEGATIVE  NEGATIVE    Ketones, ur NEGATIVE  NEGATIVE (mg/dL)   Protein, ur NEGATIVE  NEGATIVE (mg/dL)   Urobilinogen, UA 1.0  0.0 - 1.0 (mg/dL)   Nitrite NEGATIVE  NEGATIVE    Leukocytes, UA NEGATIVE  NEGATIVE   URINE MICROSCOPIC-ADD ON     Status: Abnormal   Collection Time   01/17/12  4:41 PM      Component Value Range   Squamous Epithelial / LPF FEW (*) RARE    RBC / HPF 0-2  <3 (RBC/hpf)  URINE RAPID DRUG SCREEN (HOSP PERFORMED)     Status: Normal   Collection Time   01/17/12  4:42 PM      Component Value Range   Opiates NONE DETECTED  NONE DETECTED    Cocaine NONE DETECTED  NONE DETECTED    Benzodiazepines NONE DETECTED  NONE DETECTED    Amphetamines NONE DETECTED  NONE DETECTED    Tetrahydrocannabinol NONE DETECTED  NONE DETECTED    Barbiturates NONE DETECTED  NONE DETECTED   BASIC METABOLIC PANEL     Status: Abnormal   Collection Time   01/17/12 10:12 PM      Component Value Range   Sodium 139  135 - 145 (mEq/L)   Potassium 3.7  3.5 - 5.1 (mEq/L)   Chloride 104  96 - 112 (mEq/L)   CO2 23  19 - 32 (mEq/L)   Glucose, Bld 166 (*) 70 - 99 (mg/dL)   BUN 4 (*) 6 - 23 (mg/dL)   Creatinine, Ser 7.82  0.50 - 1.10 (mg/dL)   Calcium 8.9  8.4 - 95.6 (mg/dL)   GFR calc non Af Amer >90  >90 (mL/min)   GFR calc Af Amer >90  >90 (mL/min)  CBC     Status: Abnormal   Collection Time   01/17/12 10:12 PM      Component Value Range   WBC 7.1  4.0 - 10.5 (K/uL)   RBC 4.32  3.87 - 5.11 (MIL/uL)   Hemoglobin 11.9 (*) 12.0 - 15.0 (g/dL)   HCT 36.7  36.0 - 46.0 (%)   MCV 85.0  78.0 - 100.0 (fL)   MCH 27.5  26.0 - 34.0 (pg)   MCHC 32.4  30.0 - 36.0 (g/dL)   RDW 16.1  09.6 - 04.5 (%)   Platelets 295   150 - 400 (K/uL)  TSH     Status: Normal   Collection Time   01/17/12 10:12 PM      Component Value Range   TSH 2.342  0.350 - 4.500 (uIU/mL)  HEMOGLOBIN A1C     Status: Abnormal   Collection Time   01/17/12 10:12 PM      Component Value Range   Hemoglobin A1C 8.2 (*) <5.7 (%)   Mean Plasma Glucose 189 (*) <117 (mg/dL)  BASIC METABOLIC PANEL     Status: Abnormal   Collection Time   01/18/12  5:53 AM      Component Value Range   Sodium 140  135 - 145 (mEq/L)   Potassium 3.8  3.5 - 5.1 (mEq/L)   Chloride 106  96 - 112 (mEq/L)   CO2 26  19 - 32 (mEq/L)   Glucose, Bld 183 (*) 70 - 99 (mg/dL)   BUN 5 (*) 6 - 23 (mg/dL)   Creatinine, Ser 4.09  0.50 - 1.10 (mg/dL)   Calcium 8.8  8.4 - 81.1 (mg/dL)   GFR calc non Af Amer >90  >90 (mL/min)   GFR calc Af Amer >90  >90 (mL/min)  LIPID PANEL     Status: Normal   Collection Time   01/18/12  5:53 AM      Component Value Range   Cholesterol 167  0 - 200 (mg/dL)   Triglycerides 914  <782 (mg/dL)   HDL 48  >95 (mg/dL)   Total CHOL/HDL Ratio 3.5     VLDL 28  0 - 40 (mg/dL)   LDL Cholesterol 91  0 - 99 (mg/dL)  GLUCOSE, CAPILLARY     Status: Abnormal   Collection Time   01/18/12  7:41 AM      Component Value Range   Glucose-Capillary 183 (*) 70 - 99 (mg/dL)  GLUCOSE, CAPILLARY     Status: Abnormal   Collection Time   01/18/12 11:26 AM      Component Value Range   Glucose-Capillary 217 (*) 70 - 99 (mg/dL)    Signed: Kristie Cowman 01/18/2012, 12:26 PM

## 2012-01-18 NOTE — Discharge Instructions (Signed)
Your Metformin should be taken as follows: Take 1000 mg with breakfast and 500 mg with dinner for 1 week THEN take 1000 mg with breakfast and 100 mg with dinner.  This will help your body adjust to the increased doses.

## 2012-01-20 NOTE — ED Provider Notes (Signed)
I saw and evaluated the patient, reviewed the resident's note and I agree with the findings and plan and agree with their ECG interpretation. Patient presented with seizures. Pneumonia in the x-ray. Previous history of seizures. Patient did have what appeared to be a pseudoseizure on the ER and getting seen by admitting doctors. She'll be admitted for further violation  Juliet Rude. Rubin Payor, MD 01/20/12 2352

## 2016-01-30 DIAGNOSIS — G629 Polyneuropathy, unspecified: Secondary | ICD-10-CM | POA: Insufficient documentation

## 2016-11-25 DIAGNOSIS — G5600 Carpal tunnel syndrome, unspecified upper limb: Secondary | ICD-10-CM | POA: Insufficient documentation

## 2018-08-31 ENCOUNTER — Emergency Department (HOSPITAL_COMMUNITY): Payer: Medicaid - Out of State

## 2018-08-31 ENCOUNTER — Emergency Department (HOSPITAL_COMMUNITY)
Admission: EM | Admit: 2018-08-31 | Discharge: 2018-08-31 | Disposition: A | Discharge status: Home / Self Care | Payer: Medicaid - Out of State | Attending: Emergency Medicine | Admitting: Emergency Medicine

## 2018-08-31 ENCOUNTER — Encounter (HOSPITAL_COMMUNITY): Payer: Self-pay | Admitting: Emergency Medicine

## 2018-08-31 DIAGNOSIS — E119 Type 2 diabetes mellitus without complications: Secondary | ICD-10-CM | POA: Insufficient documentation

## 2018-08-31 DIAGNOSIS — I10 Essential (primary) hypertension: Secondary | ICD-10-CM | POA: Insufficient documentation

## 2018-08-31 DIAGNOSIS — Z7984 Long term (current) use of oral hypoglycemic drugs: Secondary | ICD-10-CM | POA: Diagnosis not present

## 2018-08-31 DIAGNOSIS — Z87891 Personal history of nicotine dependence: Secondary | ICD-10-CM | POA: Diagnosis not present

## 2018-08-31 DIAGNOSIS — Z7982 Long term (current) use of aspirin: Secondary | ICD-10-CM | POA: Diagnosis not present

## 2018-08-31 DIAGNOSIS — R519 Headache, unspecified: Secondary | ICD-10-CM

## 2018-08-31 DIAGNOSIS — R51 Headache: Secondary | ICD-10-CM | POA: Insufficient documentation

## 2018-08-31 DIAGNOSIS — Z79899 Other long term (current) drug therapy: Secondary | ICD-10-CM | POA: Diagnosis not present

## 2018-08-31 LAB — BASIC METABOLIC PANEL
ANION GAP: 13 (ref 5–15)
BUN: 10 mg/dL (ref 6–20)
CHLORIDE: 101 mmol/L (ref 98–111)
CO2: 30 mmol/L (ref 22–32)
Calcium: 9.6 mg/dL (ref 8.9–10.3)
Creatinine, Ser: 0.65 mg/dL (ref 0.44–1.00)
GFR calc Af Amer: >60 mL/min (ref >60)
GFR calc non Af Amer: >60 mL/min (ref >60)
Glucose, Bld: 114 mg/dL — ABNORMAL HIGH (ref 70–99)
POTASSIUM: 4.1 mmol/L (ref 3.5–5.1)
Sodium: 144 mmol/L (ref 135–145)

## 2018-08-31 LAB — CBC
HEMATOCRIT: 42 % (ref 36.0–46.0)
HEMOGLOBIN: 12.7 g/dL (ref 12.0–15.0)
MCH: 26.4 pg (ref 26.0–34.0)
MCHC: 30.2 g/dL (ref 30.0–36.0)
MCV: 87.3 fL (ref 80.0–100.0)
NRBC: 0 % (ref 0.0–0.2)
PLATELETS: 356 10*3/uL (ref 150–400)
RBC: 4.81 MIL/uL (ref 3.87–5.11)
RDW: 14.5 % (ref 11.5–15.5)
WBC: 8.9 10*3/uL (ref 4.0–10.5)

## 2018-08-31 LAB — CBG MONITORING, ED: Glucose-Capillary: 99 mg/dL (ref 70–99)

## 2018-08-31 MED ORDER — METOCLOPRAMIDE HCL 5 MG/ML IJ SOLN
10.0000 mg | Freq: Once | INTRAMUSCULAR | Status: DC
  Filled 2018-08-31: qty 2

## 2018-08-31 MED ORDER — SODIUM CHLORIDE 0.9 % IV SOLN
INTRAVENOUS | Status: DC
  Administered 2018-08-31: 21:00:00 via INTRAVENOUS

## 2018-08-31 MED ORDER — CELECOXIB 200 MG PO CAPS: 200.0000 mg | ORAL_CAPSULE | Freq: Two times a day (BID) | ORAL | 0 refills | Status: DC

## 2018-08-31 MED ORDER — KETOROLAC TROMETHAMINE 30 MG/ML IJ SOLN
30.0000 mg | Freq: Once | INTRAMUSCULAR | Status: AC
  Administered 2018-08-31: 30 mg via INTRAVENOUS
  Filled 2018-08-31: qty 1

## 2018-08-31 MED ORDER — ACETAMINOPHEN 500 MG PO TABS
1000.0000 mg | ORAL_TABLET | Freq: Once | ORAL | Status: AC
  Administered 2018-08-31: 1000 mg via ORAL
  Filled 2018-08-31: qty 2

## 2018-08-31 MED ORDER — SODIUM CHLORIDE 0.9 % IV BOLUS
1000.0000 mL | Freq: Once | INTRAVENOUS | Status: AC
  Administered 2018-08-31: 1000 mL via INTRAVENOUS

## 2018-08-31 MED ORDER — DIPHENHYDRAMINE HCL 50 MG/ML IJ SOLN
12.5000 mg | Freq: Once | INTRAMUSCULAR | Status: DC
  Filled 2018-08-31: qty 1

## 2018-08-31 NOTE — ED Triage Notes (Signed)
Patient here from home with complaints of headache and blurred vision that started all of a sudden today. No nausea, vomiting. Ambulatory. Reports that she is a diabetic.

## 2018-08-31 NOTE — ED Provider Notes (Signed)
[facility]-EMERGENCY DEPT Provider Note   CSN: [id number] Arrival date & time: 08/31/18  1730     History   Chief Complaint Chief Complaint  Patient presents with  . Headache  . Blurred Vision    HPI Sierra Ellis is a 57 y.o. female.  Pt presents to the ED today with headache.  She also had some blurry vision which lasted about 1/2 hour.  The pt said her vision is back to normal now.  She said she gets headaches a lot.     Past Medical History:  Diagnosis Date  . Diabetes mellitus   . Hypertension   . Seizures [facility])     Patient Active Problem List   Diagnosis Date Noted  . Nonepileptic episode (HCC) 01/18/2012  . 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 Diabetes mellitus 01/17/2012  . Hypertension 01/17/2012  . Hyperlipidemia 01/17/2012  . GERD (gastroesophageal reflux disease) 01/17/2012  . Abdominal pain 01/17/2012  . 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/17/2012    Past Surgical History:  Procedure Laterality Date  . GASTRIC BYPASS     one band has broke and was noted on her last colonoscopy     OB History   None      Home Medications    Prior to Admission medications   Medication Sig Start Date End Date Taking? Authorizing Provider  aspirin 81 MG chewable tablet Chew 81 mg by mouth daily. 08/30/18  Yes [provider]  atorvastatin (LIPITOR) 40 MG tablet Take 40 mg by mouth daily. 08/30/18  Yes [provider]  Calcium Carbonate-Vitamin D (CALCIUM 600+D) 600-400 MG-UNIT per tablet Take 1 tablet by mouth 2 (two) times daily.   Yes [provider]  CARTIA XT 180 MG 24 hr capsule Take 180 mg by mouth daily. 08/01/18  Yes [provider]  furosemide (LASIX) 20 MG tablet Take 20 mg by mouth daily. 08/30/18  Yes [provider]  glipiZIDE (GLUCOTROL) 5 MG tablet Take 2.5 mg by mouth daily. 08/30/18  Yes [provider]  LANTUS SOLOSTAR 100 UNIT/ML Solostar Pen Inject 36 Units into the skin  daily. 08/30/18  Yes [provider]  metFORMIN (GLUCOPHAGE-XR) 750 MG 24 hr tablet Take 750 mg by mouth 2 (two) times daily. 08/30/18  Yes [provider]  omeprazole (PRILOSEC) 20 MG capsule Take 20 mg by mouth daily.   Yes [provider]  potassium chloride (K-DUR,KLOR-CON) 10 MEQ tablet Take 10 mEq by mouth daily. 08/30/18  Yes [provider]  simvastatin (ZOCOR) 20 MG tablet Take 20 mg by mouth every evening.   Yes [provider]  Vitamin D, Ergocalciferol, (DRISDOL) 50000 UNITS CAPS Take 50,000 Units by mouth every 7 (seven) days.   Yes [provider]  celecoxib (CELEBREX) 200 MG capsule Take 1 capsule (200 mg total) by mouth 2 (two) times daily. 08/31/18   Jacalyn Lefevre, MD  losartan (COZAAR) 50 MG tablet Take 1 tablet (50 mg total) by mouth daily. Patient not taking: Reported on 08/31/2018 01/18/12 08/31/18  Kristie Cowman, MD  metFORMIN (GLUCOPHAGE) 1000 MG tablet Take 1 tablet (1,000 mg total) by mouth daily with breakfast. Patient not taking: Reported on 08/31/2018 01/18/12 08/31/18  Kristie Cowman, MD  metFORMIN (GLUCOPHAGE) 1000 MG tablet Take 1 tablet (1,000 mg total) by mouth 2 (two) times daily with a meal. Patient not taking: Reported on 08/31/2018 01/18/12 01/17/13  Kristie Cowman, MD  metFORMIN (GLUCOPHAGE) 1000 MG tablet Take 1 tablet (1,000 mg total) by  mouth daily with breakfast. Patient not taking: Reported on 08/31/2018 01/18/12 01/17/13  Kristie Cowman, MD  metFORMIN (GLUCOPHAGE) 1000 MG tablet Take 1 tablet (1,000 mg total) by mouth 2 (two) times daily with a meal. Patient not taking: Reported on 08/31/2018 01/18/12 01/17/13  Kristie Cowman, MD  metFORMIN (GLUCOPHAGE) 500 MG tablet Take 1 tablet (500 mg total) by mouth daily with supper. Patient not taking: Reported on 08/31/2018 01/18/12 01/17/13  Kristie Cowman, MD  metFORMIN (GLUCOPHAGE) 500 MG tablet Take 1 tablet (500 mg total) by mouth daily with supper. Patient not  taking: Reported on 08/31/2018 01/18/12 01/17/13  Kristie Cowman, MD  traZODone (DESYREL) 50 MG tablet Take 1 tablet (50 mg total) by mouth at bedtime. Patient not taking: Reported on 08/31/2018 01/18/12 02/17/12  Kristie Cowman, MD    Family History No family history on file.  Social History Social History   Tobacco Use  . Smoking status: Former Games developer  . Smokeless tobacco: Never Used  Substance Use Topics  . Alcohol use: No  . Drug use: No     Allergies   Aspirin; Demerol; Lisinopril; Losartan; Morphine; Oxycodone; Penicillins; Phentermine; and Trazodone   Review of Systems Review of Systems  Eyes: Positive for visual disturbance.  Neurological: Positive for headaches.  All other systems reviewed and are negative.    Physical Exam Updated Vital Signs BP (!) 165/95 (BP Location: Left Arm)   Pulse 61   Temp 97.9 F (36.6 C) (Oral)   Resp 18   Ht 5\' 4"  (1.626 m)   Wt 120.2 kg   SpO2 98%   BMI 45.49 kg/m   Physical Exam  Constitutional: She is oriented to person, place, and time. She appears well-developed and well-nourished.  HENT:  Head: Normocephalic and atraumatic.  Mouth/Throat: Oropharynx is clear and moist.  Eyes: Pupils are equal, round, and reactive to light. EOM are normal.  Neck: Normal range of motion. Neck supple.  Cardiovascular: Normal rate, regular rhythm, normal heart sounds and intact distal pulses.  Pulmonary/Chest: Effort normal.  Abdominal: Soft. Bowel sounds are normal.  Musculoskeletal: Normal range of motion.  Neurological: She is alert and oriented to person, place, and time. She has normal strength. She displays a negative Romberg sign.  Skin: Skin is warm and dry. Capillary refill takes less than 2 seconds.  Psychiatric: She has a normal mood and affect. Her behavior is normal.  Nursing note and vitals reviewed.    ED Treatments / Results  Labs (all labs ordered are listed, but only abnormal results are displayed) Labs Reviewed    BASIC METABOLIC PANEL - Abnormal; Notable for the following components:      Result Value   Glucose, Bld 114 (*)    All other components within normal limits  CBC  CBG MONITORING, ED    EKG None  Radiology Ct Head Wo Contrast  Result Date: 08/31/2018 CLINICAL DATA:  Headache and blurry vision.  Diabetes. EXAM: CT HEAD WITHOUT CONTRAST TECHNIQUE: Contiguous axial images were obtained from the base of the skull through the vertex without intravenous contrast. COMPARISON:  01/17/2012 FINDINGS: Brain: No mass lesion, hemorrhage, hydrocephalus, acute infarct, intra-axial, or extra-axial fluid collection. Vascular: No hyperdense vessel or unexpected calcification. Bilateral intracranial carotid atherosclerosis which is age advanced. Skull: Normal Sinuses/Orbits: Normal imaged portions of the orbits and globes. Clear paranasal sinuses and mastoid air cells. Other: None. IMPRESSION: No acute intracranial abnormality. Electronically Signed   By: Jeronimo Greaves M.D.   On: 08/31/2018 20:12  Procedures Procedures (including critical care time)  Medications Ordered in ED Medications  sodium chloride 0.9 % bolus 1,000 mL (0 mLs Intravenous Stopped 08/31/18 2030)    And  0.9 %  sodium chloride infusion ( Intravenous New Bag/Given 08/31/18 2046)  metoCLOPramide (REGLAN) injection 10 mg (10 mg Intravenous Not Given 08/31/18 1904)  diphenhydrAMINE (BENADRYL) injection 12.5 mg (12.5 mg Intravenous Not Given 08/31/18 1905)  acetaminophen (TYLENOL) tablet 1,000 mg (1,000 mg Oral Given 08/31/18 1938)  ketorolac (TORADOL) 30 MG/ML injection 30 mg (30 mg Intravenous Given 08/31/18 2047)     Initial Impression / Assessment and Plan / ED Course  I have reviewed the triage vital signs and the nursing notes.  Pertinent labs & imaging results that were available during my care of the patient were reviewed by me and considered in my medical decision making (see chart for details).     Pt is feeling much better.   She lives in [place] and is here visiting family.  She is told to f/u with her pcp when she gets home.  Return if worse.  Final Clinical Impressions(s) / ED Diagnoses   Final diagnoses:  Acute nonintractable headache, unspecified headache type    ED Discharge Orders         Ordered    celecoxib (CELEBREX) 200 MG capsule  2 times daily     08/31/18 2118           Jacalyn Lefevre, MD 08/31/18 2132

## 2018-08-31 NOTE — ED Notes (Signed)
Patient refused Benadryl and Reglan, stating that she is sensitive to some medications and she "doesn't want to have a seizure"

## 2018-10-29 ENCOUNTER — Emergency Department (HOSPITAL_COMMUNITY)
Admission: EM | Admit: 2018-10-29 | Discharge: 2018-10-29 | Disposition: A | Discharge status: Home / Self Care | Payer: Medicaid - Out of State | Attending: Emergency Medicine | Admitting: Emergency Medicine

## 2018-10-29 ENCOUNTER — Other Ambulatory Visit: Payer: Self-pay

## 2018-10-29 ENCOUNTER — Encounter (HOSPITAL_COMMUNITY): Payer: Self-pay | Admitting: Emergency Medicine

## 2018-10-29 DIAGNOSIS — E119 Type 2 diabetes mellitus without complications: Secondary | ICD-10-CM | POA: Insufficient documentation

## 2018-10-29 DIAGNOSIS — G894 Chronic pain syndrome: Secondary | ICD-10-CM | POA: Diagnosis not present

## 2018-10-29 DIAGNOSIS — Z9884 Bariatric surgery status: Secondary | ICD-10-CM | POA: Diagnosis not present

## 2018-10-29 DIAGNOSIS — M549 Dorsalgia, unspecified: Secondary | ICD-10-CM | POA: Diagnosis present

## 2018-10-29 DIAGNOSIS — R569 Unspecified convulsions: Secondary | ICD-10-CM | POA: Insufficient documentation

## 2018-10-29 DIAGNOSIS — M79662 Pain in left lower leg: Secondary | ICD-10-CM | POA: Diagnosis not present

## 2018-10-29 DIAGNOSIS — I10 Essential (primary) hypertension: Secondary | ICD-10-CM | POA: Insufficient documentation

## 2018-10-29 DIAGNOSIS — M25532 Pain in left wrist: Secondary | ICD-10-CM | POA: Insufficient documentation

## 2018-10-29 DIAGNOSIS — Z87891 Personal history of nicotine dependence: Secondary | ICD-10-CM | POA: Diagnosis not present

## 2018-10-29 DIAGNOSIS — Z7982 Long term (current) use of aspirin: Secondary | ICD-10-CM | POA: Insufficient documentation

## 2018-10-29 DIAGNOSIS — Z79899 Other long term (current) drug therapy: Secondary | ICD-10-CM | POA: Diagnosis not present

## 2018-10-29 DIAGNOSIS — Z794 Long term (current) use of insulin: Secondary | ICD-10-CM | POA: Diagnosis not present

## 2018-10-29 NOTE — ED Notes (Signed)
ED Provider at bedside. 

## 2018-10-29 NOTE — ED Notes (Signed)
Pt has c/o pain that shoots down both legs but mainly the right leg. 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 Pt also has c/o calf pain in left leg. Calf is hot to the touch- pt states this has been present x 3 years.

## 2018-10-29 NOTE — ED Provider Notes (Signed)
[facility]-EMERGENCY DEPT Provider Note   CSN: [id number] Arrival date & time: 10/29/18  0630     History   Chief Complaint Chief Complaint  Patient presents with  . Back Pain  . Leg Pain  . Wrist Pain    HPI Sierra Ellis is a 57 y.o. female.  57 year old female with history of chronic pain to her back, left lower extremity, left wrist presents with worsening symptoms.  Denies any new history of trauma.  Is taking Neurontin without relief.  No fever or chills.  Pain characterizes sharp and worse with any movement.  Scheduled to see her physician next week for follow-up visit.  Patient has been using NSAIDs with temporary relief.     Past Medical History:  Diagnosis Date  . Diabetes mellitus   . Hypertension   . Seizures [facility](HCC)     Patient Active Problem List   Diagnosis Date Noted  . Nonepileptic episode (HCC) 01/18/2012  . 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 Diabetes mellitus 01/17/2012  . Hypertension 01/17/2012  . Hyperlipidemia 01/17/2012  . GERD (gastroesophageal reflux disease) 01/17/2012  . Abdominal pain 01/17/2012  . 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/17/2012    Past Surgical History:  Procedure Laterality Date  . GASTRIC BYPASS     one band has broke and was noted on her last colonoscopy     OB History   None      Home Medications    Prior to Admission medications   Medication Sig Start Date End Date Taking? Authorizing Provider  aspirin 81 MG chewable tablet Chew 81 mg by mouth daily. 08/30/18   [provider]  atorvastatin (LIPITOR) 40 MG tablet Take 40 mg by mouth daily. 08/30/18   [provider]  Calcium Carbonate-Vitamin D (CALCIUM 600+D) 600-400 MG-UNIT per tablet Take 1 tablet by mouth 2 (two) times daily.    [provider]  CARTIA XT 180 MG 24 hr capsule Take 180 mg by mouth daily. 08/01/18   [provider]  celecoxib (CELEBREX) 200 MG capsule Take 1 capsule (200 mg total) by  mouth 2 (two) times daily. 08/31/18   Jacalyn LefevreHaviland, Julie, MD  furosemide (LASIX) 20 MG tablet Take 20 mg by mouth daily. 08/30/18   [provider]  glipiZIDE (GLUCOTROL) 5 MG tablet Take 2.5 mg by mouth daily. 08/30/18   [provider]  LANTUS SOLOSTAR 100 UNIT/ML Solostar Pen Inject 36 Units into the skin daily. 08/30/18   [provider]  losartan (COZAAR) 50 MG tablet Take 1 tablet (50 mg total) by mouth daily. Patient not taking: Reported on 08/31/2018 01/18/12 08/31/18  Kristie CowmanSchooler, Karen, MD  metFORMIN (GLUCOPHAGE) 1000 MG tablet Take 1 tablet (1,000 mg total) by mouth daily with breakfast. Patient not taking: Reported on 08/31/2018 01/18/12 08/31/18  Kristie CowmanSchooler, Karen, MD  metFORMIN (GLUCOPHAGE) 1000 MG tablet Take 1 tablet (1,000 mg total) by mouth 2 (two) times daily with a meal. Patient not taking: Reported on 08/31/2018 01/18/12 01/17/13  Kristie CowmanSchooler, Karen, MD  metFORMIN (GLUCOPHAGE) 1000 MG tablet Take 1 tablet (1,000 mg total) by mouth daily with breakfast. Patient not taking: Reported on 08/31/2018 01/18/12 01/17/13  Kristie CowmanSchooler, Karen, MD  metFORMIN (GLUCOPHAGE) 1000 MG tablet Take 1 tablet (1,000 mg total) by mouth 2 (two) times daily with a meal. Patient not taking: Reported on 08/31/2018 01/18/12 01/17/13  Kristie CowmanSchooler, Karen, MD  metFORMIN (GLUCOPHAGE) 500 MG tablet Take 1 tablet (500 mg total) by mouth daily with supper. Patient not taking: Reported  on 08/31/2018 01/18/12 01/17/13  Kristie Cowman, MD  metFORMIN (GLUCOPHAGE) 500 MG tablet Take 1 tablet (500 mg total) by mouth daily with supper. Patient not taking: Reported on 08/31/2018 01/18/12 01/17/13  Kristie Cowman, MD  metFORMIN (GLUCOPHAGE-XR) 750 MG 24 hr tablet Take 750 mg by mouth 2 (two) times daily. 08/30/18   [provider]  omeprazole (PRILOSEC) 20 MG capsule Take 20 mg by mouth daily.    [provider]  potassium chloride (K-DUR,KLOR-CON) 10 MEQ tablet Take 10 mEq by mouth daily. 08/30/18   [provider]  simvastatin (ZOCOR) 20 MG tablet Take 20 mg by mouth every evening.    [provider]  traZODone (DESYREL) 50 MG tablet Take 1 tablet (50 mg total) by mouth at bedtime. Patient not taking: Reported on 08/31/2018 01/18/12 02/17/12  Kristie Cowman, MD  Vitamin D, Ergocalciferol, (DRISDOL) 50000 UNITS CAPS Take 50,000 Units by mouth every 7 (seven) days.    [provider]    Family History No family history on file.  Social History Social History   Tobacco Use  . Smoking status: Former Games developer  . Smokeless tobacco: Never Used  Substance Use Topics  . Alcohol use: No  . Drug use: No     Allergies   Aspirin; Demerol; Lisinopril; Losartan; Morphine; Oxycodone; Penicillins; Phentermine; and Trazodone   Review of Systems Review of Systems  All other systems reviewed and are negative.    Physical Exam Updated Vital Signs BP (!) 169/83 (BP Location: Left Arm)   Pulse 85   Temp 98.7 F (37.1 C) (Oral)   Ht 1.702 m (5\' 7" )   Wt 122.5 kg   LMP 01/14/2012   SpO2 96%   BMI 42.29 kg/m   Physical Exam  Constitutional: She is oriented to person, place, and time. She appears well-developed and well-nourished.  Non-toxic appearance. No distress.  HENT:  Head: Normocephalic and atraumatic.  Eyes: Pupils are equal, round, and reactive to light. Conjunctivae, EOM and lids are normal.  Neck: Normal range of motion. Neck supple. No tracheal deviation present. No thyroid mass present.  Cardiovascular: Normal rate, regular rhythm and normal heart sounds. Exam reveals no gallop.  No murmur heard. Pulmonary/Chest: Effort normal and breath sounds normal. No stridor. No respiratory distress. She has no decreased breath sounds. She has no wheezes. She has no rhonchi. She has no rales.  Abdominal: Soft. Normal appearance and bowel sounds are normal. She exhibits no distension. There is no tenderness. There is no rebound and no CVA tenderness.  Musculoskeletal:  Normal range of motion. She exhibits no edema.       Left wrist: She exhibits tenderness. She exhibits no bony tenderness and no swelling.       Legs: Calf is not swollen.  It is not warm to the touch.  Dorsalis pedis pulse 2+.  Compartment soft.  Tenderness along lateral aspect.  Tender at right sciatic notch  Neurological: She is alert and oriented to person, place, and time. She has normal strength. No cranial nerve deficit or sensory deficit. GCS eye subscore is 4. GCS verbal subscore is 5. GCS motor subscore is 6.  Skin: Skin is warm and dry. No abrasion and no rash noted.  Psychiatric: She has a normal mood and affect. Her speech is normal and behavior is normal.  Nursing note and vitals reviewed.    ED Treatments / Results  Labs (all labs ordered are listed, but only abnormal results are displayed) Labs Reviewed -  No data to display  EKG None  Radiology No results found.  Procedures Procedures (including critical care time)  Medications Ordered in ED Medications - No data to display   Initial Impression / Assessment and Plan / ED Course  I have reviewed the triage vital signs and the nursing notes.  Pertinent labs & imaging results that were available during my care of the patient were reviewed by me and considered in my medical decision making (see chart for details).     Patient here with chronic pain.  Instructed to keep her appointment with her doctor next week  Final Clinical Impressions(s) / ED Diagnoses   Final diagnoses:  None    ED Discharge Orders    None       Lorre Nick, MD 10/29/18 [phone]

## 2018-10-29 NOTE — Discharge Instructions (Addendum)
Because of your multiple allergies I am unable to prescribe any pain medication for you at this time.  Please call your doctor on Monday to schedule your next visit earlier if possible

## 2019-07-20 ENCOUNTER — Other Ambulatory Visit: Payer: Self-pay

## 2019-07-20 ENCOUNTER — Telehealth: Payer: Self-pay | Admitting: *Deleted

## 2019-07-20 ENCOUNTER — Encounter (HOSPITAL_COMMUNITY): Payer: Self-pay | Admitting: Student

## 2019-07-20 ENCOUNTER — Emergency Department (HOSPITAL_COMMUNITY): Payer: Self-pay

## 2019-07-20 ENCOUNTER — Emergency Department (HOSPITAL_COMMUNITY)
Admission: EM | Admit: 2019-07-20 | Discharge: 2019-07-20 | Disposition: A | Discharge status: Home / Self Care | Payer: Self-pay | Attending: Emergency Medicine | Admitting: Emergency Medicine

## 2019-07-20 DIAGNOSIS — Z7982 Long term (current) use of aspirin: Secondary | ICD-10-CM | POA: Insufficient documentation

## 2019-07-20 DIAGNOSIS — E119 Type 2 diabetes mellitus without complications: Secondary | ICD-10-CM | POA: Insufficient documentation

## 2019-07-20 DIAGNOSIS — R079 Chest pain, unspecified: Secondary | ICD-10-CM | POA: Insufficient documentation

## 2019-07-20 DIAGNOSIS — Z87891 Personal history of nicotine dependence: Secondary | ICD-10-CM | POA: Insufficient documentation

## 2019-07-20 DIAGNOSIS — Z794 Long term (current) use of insulin: Secondary | ICD-10-CM | POA: Insufficient documentation

## 2019-07-20 DIAGNOSIS — I10 Essential (primary) hypertension: Secondary | ICD-10-CM | POA: Insufficient documentation

## 2019-07-20 DIAGNOSIS — Z79899 Other long term (current) drug therapy: Secondary | ICD-10-CM | POA: Insufficient documentation

## 2019-07-20 DIAGNOSIS — R112 Nausea with vomiting, unspecified: Secondary | ICD-10-CM | POA: Insufficient documentation

## 2019-07-20 DIAGNOSIS — R42 Dizziness and giddiness: Secondary | ICD-10-CM | POA: Insufficient documentation

## 2019-07-20 LAB — URINALYSIS, ROUTINE W REFLEX MICROSCOPIC
Bacteria, UA: NONE SEEN
Bilirubin Urine: NEGATIVE
Glucose, UA: >=500 mg/dL — AB
Hgb urine dipstick: NEGATIVE
Ketones, ur: 20 mg/dL — AB
Nitrite: NEGATIVE
Protein, ur: NEGATIVE mg/dL
Specific Gravity, Urine: 1.037 — ABNORMAL HIGH (ref 1.005–1.030)
pH: 5 (ref 5.0–8.0)

## 2019-07-20 LAB — BASIC METABOLIC PANEL
Anion gap: 12 (ref 5–15)
BUN: 7 mg/dL (ref 6–20)
CO2: 25 mmol/L (ref 22–32)
Calcium: 9.1 mg/dL (ref 8.9–10.3)
Chloride: 102 mmol/L (ref 98–111)
Creatinine, Ser: 0.59 mg/dL (ref 0.44–1.00)
GFR calc Af Amer: >60 mL/min (ref >60)
GFR calc non Af Amer: >60 mL/min (ref >60)
Glucose, Bld: 364 mg/dL — ABNORMAL HIGH (ref 70–99)
Potassium: 3.9 mmol/L (ref 3.5–5.1)
Sodium: 139 mmol/L (ref 135–145)

## 2019-07-20 LAB — CBC
HCT: 43.2 % (ref 36.0–46.0)
Hemoglobin: 13.4 g/dL (ref 12.0–15.0)
MCH: 27.1 pg (ref 26.0–34.0)
MCHC: 31 g/dL (ref 30.0–36.0)
MCV: 87.4 fL (ref 80.0–100.0)
Platelets: 268 10*3/uL (ref 150–400)
RBC: 4.94 MIL/uL (ref 3.87–5.11)
RDW: 14.1 % (ref 11.5–15.5)
WBC: 5.7 10*3/uL (ref 4.0–10.5)
nRBC: 0 % (ref 0.0–0.2)

## 2019-07-20 LAB — TROPONIN I (HIGH SENSITIVITY)
Troponin I (High Sensitivity): 14 ng/L (ref <18)
Troponin I (High Sensitivity): 13 ng/L (ref <18)

## 2019-07-20 MED ORDER — MECLIZINE HCL 25 MG PO TABS: 25.0000 mg | ORAL_TABLET | Freq: Three times a day (TID) | ORAL | 0 refills | Status: AC | PRN

## 2019-07-20 MED ORDER — FLUCONAZOLE 150 MG PO TABS: ORAL_TABLET | ORAL | 0 refills | Status: DC

## 2019-07-20 MED ORDER — MECLIZINE HCL 25 MG PO TABS
25.0000 mg | ORAL_TABLET | Freq: Once | ORAL | Status: AC
  Administered 2019-07-20: 10:00:00 25 mg via ORAL
  Filled 2019-07-20: qty 1

## 2019-07-20 MED ORDER — SODIUM CHLORIDE 0.9 % IV BOLUS: 1000.0000 mL | Freq: Once | INTRAVENOUS | Status: DC

## 2019-07-20 MED ORDER — ACETAMINOPHEN 325 MG PO TABS
650.0000 mg | ORAL_TABLET | Freq: Once | ORAL | Status: AC
  Administered 2019-07-20: 650 mg via ORAL
  Filled 2019-07-20: qty 2

## 2019-07-20 MED ORDER — NYSTATIN 100000 UNIT/GM EX CREA: TOPICAL_CREAM | CUTANEOUS | 0 refills | Status: DC

## 2019-07-20 NOTE — Discharge Summary (Signed)
[name] J. [name], RN, BSN, [place] [phone]  [facility] set up appointment with Sierra Ellis on 9/11@0910 .  Spoke with pt at bedside and advised to please arrive 15 min early and take a picture ID and your current medications.  Pt verbalizes understanding of keeping appointment.

## 2019-07-20 NOTE — ED Provider Notes (Signed)
[facility] Provider Note   CSN: [id number] Arrival date & time: 07/20/19  0846     History   Chief Complaint Chief Complaint  Patient presents with  . Dizziness  . Chest Pain    HPI Tymber [facility] is a 58 y.o. female with a hx of T2DM, HTN, prior seizures/pseudoseizuzres, & prior gastric bypass who presents to the ED w/ complaints of intermittent chest pain & dizziness since last night. Patient reports that last night while she was laying in bed she rolled over onto her left side & became dizzy like the room spinning. She states dizziness lasted 5-6 minutes prior to spontaneously resolution. This has been occurring intermittently since onset mostly with head movements/position changes. Has had some associated nausea w/ dizziness. Also reports that she has had intermittent left sided chest pain described as stabbing & non-radiating, not necessarily corresponding with dizziness. Current pain is a 4/10 in severity. No alleviating/aggravating factors. Not worse with exertion. Denies syncope, visual disturbance, numbness, weakness, vomiting, dyspnea, unilateral leg swelling,  hemoptysis, recent surgery/trauma, recent long travel, hormone use, personal hx of cancer, or hx of DVT/PE. She is new to the area and has had issues establishing care with PCP, she has not had medicines in 4 months- these are at the pharmacy, but she cannot afford them. She states she had similar chest pain/dizziness a few years ago, saw cardiology, & was told her chest pain was likely arthritis.  Patient also mentions some pruritus and erythema to the GU area, she states that she has had prior yeast infections, she states this feels similar.  She has not had substantial vaginal discharge.  She is not currently sexually active and has no concern for STDs.      HPI  Past Medical History:  Diagnosis Date  . Diabetes mellitus   . Hypertension   . Seizures [facility])     Patient Active  Problem List   Diagnosis Date Noted  . Nonepileptic episode (HCC) 01/18/2012  . 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 Diabetes mellitus 01/17/2012  . Hypertension 01/17/2012  . Hyperlipidemia 01/17/2012  . GERD (gastroesophageal reflux disease) 01/17/2012  . Abdominal pain 01/17/2012  . 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/17/2012    Past Surgical History:  Procedure Laterality Date  . GASTRIC BYPASS     one band has broke and was noted on her last colonoscopy     OB History   No obstetric history on file.      Home Medications    Prior to Admission medications   Medication Sig Start Date End Date Taking? Authorizing Provider  aspirin 81 MG chewable tablet Chew 81 mg by mouth daily. 08/30/18   [provider]  atorvastatin (LIPITOR) 40 MG tablet Take 40 mg by mouth daily. 08/30/18   [provider]  Calcium Carbonate-Vitamin D (CALCIUM 600+D) 600-400 MG-UNIT per tablet Take 1 tablet by mouth 2 (two) times daily.    [provider]  CARTIA XT 180 MG 24 hr capsule Take 180 mg by mouth daily. 08/01/18   [provider]  celecoxib (CELEBREX) 200 MG capsule Take 1 capsule (200 mg total) by mouth 2 (two) times daily. 08/31/18   Jacalyn Lefevre, MD  furosemide (LASIX) 20 MG tablet Take 20 mg by mouth daily. 08/30/18   [provider]  glipiZIDE (GLUCOTROL) 5 MG tablet Take 2.5 mg by mouth daily. 08/30/18   [provider]  LANTUS SOLOSTAR 100 UNIT/ML Solostar Pen Inject 36 Units into  the skin daily. 08/30/18   [provider]  losartan (COZAAR) 50 MG tablet Take 1 tablet (50 mg total) by mouth daily. Patient not taking: Reported on 08/31/2018 01/18/12 08/31/18  Kristie CowmanSchooler, Karen, MD  metFORMIN (GLUCOPHAGE) 1000 MG tablet Take 1 tablet (1,000 mg total) by mouth daily with breakfast. Patient not taking: Reported on 08/31/2018 01/18/12 08/31/18  Kristie CowmanSchooler, Karen, MD  metFORMIN (GLUCOPHAGE) 1000 MG tablet Take 1 tablet (1,000 mg total) by mouth 2  (two) times daily with a meal. Patient not taking: Reported on 08/31/2018 01/18/12 01/17/13  Kristie CowmanSchooler, Karen, MD  metFORMIN (GLUCOPHAGE) 1000 MG tablet Take 1 tablet (1,000 mg total) by mouth daily with breakfast. Patient not taking: Reported on 08/31/2018 01/18/12 01/17/13  Kristie CowmanSchooler, Karen, MD  metFORMIN (GLUCOPHAGE) 1000 MG tablet Take 1 tablet (1,000 mg total) by mouth 2 (two) times daily with a meal. Patient not taking: Reported on 08/31/2018 01/18/12 01/17/13  Kristie CowmanSchooler, Karen, MD  metFORMIN (GLUCOPHAGE) 500 MG tablet Take 1 tablet (500 mg total) by mouth daily with supper. Patient not taking: Reported on 08/31/2018 01/18/12 01/17/13  Kristie CowmanSchooler, Karen, MD  metFORMIN (GLUCOPHAGE) 500 MG tablet Take 1 tablet (500 mg total) by mouth daily with supper. Patient not taking: Reported on 08/31/2018 01/18/12 01/17/13  Kristie CowmanSchooler, Karen, MD  metFORMIN (GLUCOPHAGE-XR) 750 MG 24 hr tablet Take 750 mg by mouth 2 (two) times daily. 08/30/18   [provider]  omeprazole (PRILOSEC) 20 MG capsule Take 20 mg by mouth daily.    [provider]  potassium chloride (K-DUR,KLOR-CON) 10 MEQ tablet Take 10 mEq by mouth daily. 08/30/18   [provider]  simvastatin (ZOCOR) 20 MG tablet Take 20 mg by mouth every evening.    [provider]  traZODone (DESYREL) 50 MG tablet Take 1 tablet (50 mg total) by mouth at bedtime. Patient not taking: Reported on 08/31/2018 01/18/12 02/17/12  Kristie CowmanSchooler, Karen, MD  Vitamin D, Ergocalciferol, (DRISDOL) 50000 UNITS CAPS Take 50,000 Units by mouth every 7 (seven) days.    [provider]    Family History No family history on file.  Social History Social History   Tobacco Use  . Smoking status: Former Games developermoker  . Smokeless tobacco: Never Used  Substance Use Topics  . Alcohol use: No  . Drug use: No     Allergies   Aspirin, Demerol, Lisinopril, Losartan, Morphine, Oxycodone, Penicillins, Phentermine, and Trazodone   Review of Systems Review of  Systems  Constitutional: Negative for chills and fever.  HENT: Negative for congestion, ear discharge and ear pain.   Respiratory: Negative for cough and shortness of breath.   Cardiovascular: Positive for chest pain. Negative for leg swelling.  Gastrointestinal: Positive for nausea. Negative for abdominal pain, blood in stool, constipation, diarrhea and vomiting.  Genitourinary: Negative for dysuria, vaginal bleeding and vaginal discharge.  Skin: Positive for rash.  Neurological: Positive for dizziness. Negative for syncope, speech difficulty, weakness, numbness and headaches.  All other systems reviewed and are negative.  Physical Exam Updated Vital Signs BP (!) 166/63   Pulse 92   Temp 98.4 F (36.9 C) (Oral)   Resp 18   LMP 06/07/2019 (Approximate)   SpO2 93%   Physical Exam Vitals signs and nursing note reviewed. Exam conducted with a chaperone present.  Constitutional:      General: She is not in acute distress.    Appearance: She is well-developed. She is not toxic-appearing.  HENT:     Head: Normocephalic and atraumatic.  Right Ear: Tympanic membrane is not perforated, erythematous, retracted or bulging.     Left Ear: Tympanic membrane is not perforated, erythematous, retracted or bulging.     Ears:     Comments: No mastoid erythema/swelling/tenderness.     Nose: Nose normal.     Mouth/Throat:     Pharynx: Uvula midline.  Eyes:     General: Vision grossly intact.        Right eye: No discharge.        Left eye: No discharge.     Extraocular Movements: Extraocular movements intact.     Conjunctiva/sclera: Conjunctivae normal.     Pupils: Pupils are equal, round, and reactive to light.     Comments: No rotational/vertical nystagmus.   Neck:     Musculoskeletal: Neck supple.  Cardiovascular:     Rate and Rhythm: Normal rate and regular rhythm.  Pulmonary:     Effort: Pulmonary effort is normal. No respiratory distress.     Breath sounds: Normal breath sounds.  No wheezing, rhonchi or rales.  Chest:     Chest wall: Tenderness (left anterior chest wall without overlying skin changes or palpable crepitus) present.  Abdominal:     General: There is no distension.     Palpations: Abdomen is soft.     Tenderness: There is no abdominal tenderness.  Genitourinary:    Comments: Patient has mild erythematous rash to the labia as well as the groin bilaterally with satellite lesions, seems consistent with yeast infection.  Speculumbimanual exam deferred Skin:    General: Skin is warm and dry.     Findings: No rash.  Neurological:     Mental Status: She is alert.     Comments: Alert. Clear speech. No facial droop. CNIII-XII grossly intact. Bilateral upper and lower extremities' sensation grossly intact. 5/5 symmetric strength with grip strength and with plantar and dorsi flexion bilaterally. Normal finger to nose bilaterally. Negative pronator drift. Negative Romberg sign. Gait is steady and intact/   Psychiatric:        Behavior: Behavior normal.     ED Treatments / Results  Labs (all labs ordered are listed, but only abnormal results are displayed) Labs Reviewed  BASIC METABOLIC PANEL - Abnormal; Notable for the following components:      Result Value   Glucose, Bld 364 (*)    All other components within normal limits  URINALYSIS, ROUTINE W REFLEX MICROSCOPIC - Abnormal; Notable for the following components:   Specific Gravity, Urine 1.037 (*)    Glucose, UA >=500 (*)    Ketones, ur 20 (*)    Leukocytes,Ua MODERATE (*)    All other components within normal limits  URINE CULTURE  CBC  TROPONIN I (HIGH SENSITIVITY)  TROPONIN I (HIGH SENSITIVITY)    EKG EKG Interpretation  Date/Time:  Thursday July 20 2019 09:13:10 EDT Ventricular Rate:  105 PR Interval:    QRS Duration: 98 QT Interval:  373 QTC Calculation: 493 R Axis:   53 Text Interpretation:  Sinus tachycardia Borderline prolonged QT interval Confirmed by Kennis CarinaBero, Michael [phone]([id number]) on  07/20/2019 9:19:57 AM   Radiology Dg Chest 2 View  Result Date: 07/20/2019 CLINICAL DATA:  Chest pain. EXAM: CHEST - 2 VIEW COMPARISON:  Radiographs of January 17, 2012. FINDINGS: The heart size and mediastinal contours are within normal limits. Both lungs are clear. No pneumothorax or pleural effusion is noted. The visualized skeletal structures are unremarkable. IMPRESSION: No active cardiopulmonary disease. Electronically Signed   By: Fayrene FearingJames  Murlean Caller M.D.   On: 07/20/2019 09:51    Procedures Procedures (including critical care time)  Medications Ordered in ED Medications - No data to display   Initial Impression / Assessment and Plan / ED Course  I have reviewed the triage vital signs and the nursing notes.  Pertinent labs & imaging results that were available during my care of the patient were reviewed by me and considered in my medical decision making (see chart for details).   Patient presents to the ED w/ complaints of intermittent dizziness & intermittent chest pain since last night.  Patient is nontoxic-appearing, no apparent distress, vitals WNL with the exception of her elevated blood pressure, I have a low suspicion for hypertensive emergency, she has not been taking her antihypertensive medications as she has run out.  She has an overall benign physical exam.  Her dizziness and chest pain that is coming and going do not seem to necessarily correspond and occur simultaneously.    Regarding her dizziness: She has no focal neurologic deficits, the dizziness not persistent, this does not seem consistent with a central neurologic process such as an acute ischemic/hemorrhagic CVA.  High sufficient for peripheral vertigo, will trial meclizine.  Regarding her chest pain: There is some reproducibility with left anterior chest wall palpation raising concern for musculoskeletal etiology, she states she has had similar pain which she was told was arthritis related, reports she did have a  stress test 3 years ago that did not have concerning findings.  She is low risk Wells, doubt PE.  We will proceed with EKG, serial troponins, and placed on cardiac monitor.  Basic labs and urinalysis have also been ordered.  CBC: No anemia or leukocytosis.  BMP: Hyperglycemia w/o anion gap elevation or acidosis. No significant electrolyte derangement.  UA: Dehydration, moderate leukocytes- no urinary sxs, sent for culture.  Trop: 14 I 13 EKG: no STEMI or significant ischemic changes.  CXR: No acute cardiopulmonary process.  EKG w/o ischemic changes, trop without significant change- based on   Work-up overall reassuring. She does not appear to be in DKA, case management was consulted regarding her issues with affording medications and establishing PCP in the area, they have scheduled follow-up appointment for her we will call her in regards to her prescriptions.  Dizziness: Improved following meclizine, suspect peripheral vertigo, will discharge home with this medication. Chest pain: Delta troponin without significant change, EKG without ischemic changes, doubt ACS.  Low risk Wells, doubt PE.  No widened mediastinum, no tearing sensation, doubt dissection.  Seems musculoskeletal given reproducibility with left chest wall palpation, she also has a history of similar with "arthritis".  PCP recheck. GU rash: No urinary symptoms, urine sent for culture.  She is not sexually active, she does not have concern for STDs, speculum/bimanual exam deferred.  Seems consistent with yeast infection, will treat with Diflucan and topical nystatin.  Patient having substantial improvement in the emergency department, she states she is ready for discharge, she has been given resources, discount card for her prescriptions today, as well as information for follow-up. I discussed results, treatment plan, need for follow-up, and return precautions with the patient. Provided opportunity for questions, patient confirmed  understanding and is in agreement with plan.   Findings and plan of care discussed with supervising physician Dr. Sedonia Small who has evaluated patient & in agreement.   Final Clinical Impressions(s) / ED Diagnoses   Final diagnoses:  Dizziness  Chest pain, unspecified type    ED Discharge Orders  Ordered    meclizine (ANTIVERT) 25 MG tablet  3 times daily PRN     07/20/19 1514    fluconazole (DIFLUCAN) 150 MG tablet     07/20/19 1518    nystatin cream (MYCOSTATIN)     07/20/19 1518           Kemyra August, Pleas Koch, PA-C 07/20/19 1530    Sabas Sous, MD 07/27/19 [phone]

## 2019-07-20 NOTE — Discharge Instructions (Addendum)
You were seen in the emergency department today for chest pain and dizziness. Your work-up in the emergency department has been overall reassuring. Your labs have been fairly normal and or similar to previous blood work you have had done-your blood sugar however was elevated, it is important that you monitor this at home and have it rechecked by primary care.. Your EKG and the enzyme we use to check your heart did not show an acute heart attack at this time. Your chest x-ray was normal.   We suspect your dizziness was related to vertigo, please take 25 mg of meclizine 3 times per day as needed for dizziness.  We suspect that your vaginal irritation is due to a yeast infection, we are sending you home with Diflucan, take once today and repeat in 72 hours if rash does not resolve.  We are also sending you with a topical cream to apply to the external areas that are irritated twice per day, do not insert this into your vagina.   We have prescribed you new medication(s) today. Discuss the medications prescribed today with your pharmacist as they can have adverse effects and interactions with your other medicines including over the counter and prescribed medications. Seek medical evaluation if you start to experience new or abnormal symptoms after taking one of these medicines, seek care immediately if you start to experience difficulty breathing, feeling of your throat closing, facial swelling, or rash as these could be indications of a more serious allergic reaction  Please follow-up with the outpatient office that our case manager has provided in your discharge instructions.   Our case management team will help call to help you with assistance for financing your medicines.  Follow-up with primary care within 3 to 5 days.  Return to the ER immediately should you experience any new or worsening symptoms including but not limited to return of pain, worsened pain, vomiting, shortness of breath, persistent  dizziness, fever, lightheadedness, passing out, or any other concerns that you may have.

## 2019-07-20 NOTE — ED Triage Notes (Signed)
Per pt, states she woke up this am feeling dizzy and having slight chest discomfort-states she has been unable to afford her medication-out of meds for 3 months

## 2019-07-20 NOTE — Telephone Encounter (Signed)
TOC CM received message stating she was unable to afford her medication. Contacted pt and her pharmacy for meds.    Medication Assistance/No insurance   [facility]) program with $3 copay for each Rx excludes Narcotics, does not include refills with a once per year use with expiration after 7 days at participating pharmacies. After [employer] review, pt eligible for MATCH. Pt has accepted to participate in program under terms discussed. Letter faxed to her pharmacy. Pt was instructed to continue receiving her meds from [facility]. She has follow up appt on 9/11 at [facility].   Jonnie Finner RN CCM Case Mgmt phone [phone]

## 2019-07-22 LAB — URINE CULTURE

## 2019-08-04 ENCOUNTER — Inpatient Hospital Stay (INDEPENDENT_AMBULATORY_CARE_PROVIDER_SITE_OTHER): Payer: Self-pay | Admitting: Primary Care

## 2020-01-02 DIAGNOSIS — Z87898 Personal history of other specified conditions: Secondary | ICD-10-CM | POA: Insufficient documentation

## 2020-01-02 DIAGNOSIS — K219 Gastro-esophageal reflux disease without esophagitis: Secondary | ICD-10-CM | POA: Insufficient documentation

## 2020-01-02 DIAGNOSIS — Z87891 Personal history of nicotine dependence: Secondary | ICD-10-CM | POA: Insufficient documentation

## 2020-01-20 ENCOUNTER — Inpatient Hospital Stay (HOSPITAL_COMMUNITY)
Admission: EM | Admit: 2020-01-20 | Discharge: 2020-01-25 | DRG: 177 | Disposition: A | Discharge status: Home / Self Care | Payer: Medicare HMO | Attending: Internal Medicine | Admitting: Internal Medicine

## 2020-01-20 ENCOUNTER — Other Ambulatory Visit: Payer: Self-pay

## 2020-01-20 ENCOUNTER — Emergency Department (HOSPITAL_COMMUNITY): Payer: Medicare HMO

## 2020-01-20 ENCOUNTER — Encounter (HOSPITAL_COMMUNITY): Payer: Self-pay | Admitting: Emergency Medicine

## 2020-01-20 DIAGNOSIS — I25119 Atherosclerotic heart disease of native coronary artery with unspecified angina pectoris: Secondary | ICD-10-CM

## 2020-01-20 DIAGNOSIS — I2583 Coronary atherosclerosis due to lipid rich plaque: Secondary | ICD-10-CM | POA: Diagnosis not present

## 2020-01-20 DIAGNOSIS — R569 Unspecified convulsions: Secondary | ICD-10-CM

## 2020-01-20 DIAGNOSIS — J45909 Unspecified asthma, uncomplicated: Secondary | ICD-10-CM | POA: Diagnosis present

## 2020-01-20 DIAGNOSIS — I10 Essential (primary) hypertension: Secondary | ICD-10-CM

## 2020-01-20 DIAGNOSIS — J9601 Acute respiratory failure with hypoxia: Secondary | ICD-10-CM | POA: Diagnosis present

## 2020-01-20 DIAGNOSIS — Z9884 Bariatric surgery status: Secondary | ICD-10-CM | POA: Diagnosis not present

## 2020-01-20 DIAGNOSIS — U071 COVID-19: Secondary | ICD-10-CM | POA: Diagnosis present

## 2020-01-20 DIAGNOSIS — J1282 Pneumonia due to coronavirus disease 2019: Secondary | ICD-10-CM | POA: Diagnosis present

## 2020-01-20 DIAGNOSIS — I2510 Atherosclerotic heart disease of native coronary artery without angina pectoris: Secondary | ICD-10-CM | POA: Diagnosis present

## 2020-01-20 DIAGNOSIS — E876 Hypokalemia: Secondary | ICD-10-CM | POA: Diagnosis not present

## 2020-01-20 DIAGNOSIS — E1169 Type 2 diabetes mellitus with other specified complication: Secondary | ICD-10-CM | POA: Diagnosis not present

## 2020-01-20 DIAGNOSIS — Z794 Long term (current) use of insulin: Secondary | ICD-10-CM

## 2020-01-20 DIAGNOSIS — Z87891 Personal history of nicotine dependence: Secondary | ICD-10-CM

## 2020-01-20 DIAGNOSIS — IMO0002 Reserved for concepts with insufficient information to code with codable children: Secondary | ICD-10-CM | POA: Diagnosis present

## 2020-01-20 DIAGNOSIS — Z6841 Body Mass Index (BMI) 40.0 and over, adult: Secondary | ICD-10-CM | POA: Diagnosis not present

## 2020-01-20 DIAGNOSIS — G4733 Obstructive sleep apnea (adult) (pediatric): Secondary | ICD-10-CM | POA: Diagnosis not present

## 2020-01-20 DIAGNOSIS — E1165 Type 2 diabetes mellitus with hyperglycemia: Secondary | ICD-10-CM | POA: Diagnosis present

## 2020-01-20 DIAGNOSIS — K219 Gastro-esophageal reflux disease without esophagitis: Secondary | ICD-10-CM | POA: Diagnosis present

## 2020-01-20 DIAGNOSIS — E785 Hyperlipidemia, unspecified: Secondary | ICD-10-CM | POA: Diagnosis not present

## 2020-01-20 HISTORY — DX: Pneumonia due to coronavirus disease 2019: J12.82

## 2020-01-20 HISTORY — DX: COVID-19: U07.1

## 2020-01-20 LAB — LACTATE DEHYDROGENASE: LDH: 255 U/L — ABNORMAL HIGH (ref 98–192)

## 2020-01-20 LAB — CBC WITH DIFFERENTIAL/PLATELET
Abs Immature Granulocytes: 0.03 10*3/uL (ref 0.00–0.07)
Basophils Absolute: 0 10*3/uL (ref 0.0–0.1)
Basophils Relative: 0 %
Eosinophils Absolute: 0 10*3/uL (ref 0.0–0.5)
Eosinophils Relative: 0 %
HCT: 41 % (ref 36.0–46.0)
Hemoglobin: 12.7 g/dL (ref 12.0–15.0)
Immature Granulocytes: 1 %
Lymphocytes Relative: 20 %
Lymphs Abs: 1.3 10*3/uL (ref 0.7–4.0)
MCH: 27.4 pg (ref 26.0–34.0)
MCHC: 31 g/dL (ref 30.0–36.0)
MCV: 88.6 fL (ref 80.0–100.0)
Monocytes Absolute: 0.4 10*3/uL (ref 0.1–1.0)
Monocytes Relative: 7 %
Neutro Abs: 4.6 10*3/uL (ref 1.7–7.7)
Neutrophils Relative %: 72 %
Platelets: 293 10*3/uL (ref 150–400)
RBC: 4.63 MIL/uL (ref 3.87–5.11)
RDW: 13.2 % (ref 11.5–15.5)
WBC: 6.3 10*3/uL (ref 4.0–10.5)
nRBC: 0 % (ref 0.0–0.2)

## 2020-01-20 LAB — COMPREHENSIVE METABOLIC PANEL
ALT: 32 U/L (ref 0–44)
AST: 31 U/L (ref 15–41)
Albumin: 3.3 g/dL — ABNORMAL LOW (ref 3.5–5.0)
Alkaline Phosphatase: 118 U/L (ref 38–126)
Anion gap: 9 (ref 5–15)
BUN: 5 mg/dL — ABNORMAL LOW (ref 6–20)
CO2: 29 mmol/L (ref 22–32)
Calcium: 8.4 mg/dL — ABNORMAL LOW (ref 8.9–10.3)
Chloride: 100 mmol/L (ref 98–111)
Creatinine, Ser: 0.44 mg/dL (ref 0.44–1.00)
GFR calc Af Amer: >60 mL/min (ref >60)
GFR calc non Af Amer: >60 mL/min (ref >60)
Glucose, Bld: 256 mg/dL — ABNORMAL HIGH (ref 70–99)
Potassium: 3.3 mmol/L — ABNORMAL LOW (ref 3.5–5.1)
Sodium: 138 mmol/L (ref 135–145)
Total Bilirubin: 0.9 mg/dL (ref 0.3–1.2)
Total Protein: 7.4 g/dL (ref 6.5–8.1)

## 2020-01-20 LAB — I-STAT BETA HCG BLOOD, ED (MC, WL, AP ONLY): I-stat hCG, quantitative: <5 m[IU]/mL (ref <5)

## 2020-01-20 LAB — D-DIMER, QUANTITATIVE: D-Dimer, Quant: 0.96 ug/mL-FEU — ABNORMAL HIGH (ref 0.00–0.50)

## 2020-01-20 LAB — FERRITIN: Ferritin: 171 ng/mL (ref 11–307)

## 2020-01-20 LAB — PROCALCITONIN: Procalcitonin: 0.1 ng/mL

## 2020-01-20 LAB — TRIGLYCERIDES: Triglycerides: 91 mg/dL (ref <150)

## 2020-01-20 LAB — LACTIC ACID, PLASMA: Lactic Acid, Venous: 1.2 mmol/L (ref 0.5–1.9)

## 2020-01-20 LAB — C-REACTIVE PROTEIN: CRP: 8.6 mg/dL — ABNORMAL HIGH (ref <1.0)

## 2020-01-20 LAB — FIBRINOGEN: Fibrinogen: 553 mg/dL — ABNORMAL HIGH (ref 210–475)

## 2020-01-20 MED ORDER — DEXAMETHASONE SODIUM PHOSPHATE 10 MG/ML IJ SOLN
6.0000 mg | Freq: Every day | INTRAMUSCULAR | Status: DC
  Administered 2020-01-21 – 2020-01-24 (×5): 6 mg via INTRAVENOUS
  Filled 2020-01-20 (×5): qty 1

## 2020-01-20 MED ORDER — ENOXAPARIN SODIUM 40 MG/0.4ML ~~LOC~~ SOLN: 40.0000 mg | Freq: Every day | SUBCUTANEOUS | Status: DC

## 2020-01-20 MED ORDER — INSULIN GLARGINE 100 UNIT/ML ~~LOC~~ SOLN
24.0000 [IU] | Freq: Every day | SUBCUTANEOUS | Status: DC
  Administered 2020-01-21 (×2): 24 [IU] via SUBCUTANEOUS
  Filled 2020-01-20 (×2): qty 0.24

## 2020-01-20 MED ORDER — INSULIN ASPART 100 UNIT/ML ~~LOC~~ SOLN
0.0000 [IU] | Freq: Three times a day (TID) | SUBCUTANEOUS | Status: DC
  Administered 2020-01-21: 20 [IU] via SUBCUTANEOUS
  Administered 2020-01-21: 11 [IU] via SUBCUTANEOUS
  Administered 2020-01-21: 15 [IU] via SUBCUTANEOUS
  Administered 2020-01-22 (×3): 20 [IU] via SUBCUTANEOUS
  Administered 2020-01-23 (×2): 15 [IU] via SUBCUTANEOUS
  Administered 2020-01-23: 7 [IU] via SUBCUTANEOUS
  Administered 2020-01-24: 11 [IU] via SUBCUTANEOUS
  Administered 2020-01-24 (×2): 4 [IU] via SUBCUTANEOUS
  Administered 2020-01-25: 11 [IU] via SUBCUTANEOUS
  Filled 2020-01-20: qty 0.2

## 2020-01-20 MED ORDER — ENOXAPARIN SODIUM 60 MG/0.6ML ~~LOC~~ SOLN
60.0000 mg | Freq: Every day | SUBCUTANEOUS | Status: DC
  Administered 2020-01-21 – 2020-01-24 (×5): 60 mg via SUBCUTANEOUS
  Filled 2020-01-20 (×5): qty 0.6

## 2020-01-20 NOTE — ED Provider Notes (Signed)
Emergency Department Provider Note   I have reviewed the triage vital signs and the nursing notes.   HISTORY  Chief Complaint Fatigue and Dizziness   HPI Sierra Ellis is a 59 y.o. female with PMH of DM, asthma, HTN, and elevated BMI presents to the ED on day 8 of COVID 19 symptoms with shortness of breath, chest pain, fatigue, dizziness.  Patient was diagnosed at [facility] earlier this week.  She has been managing with supportive care at home but has not been on monoclonal antibodies or other therapies for Covid.  In the last 48 hours she has developed shortness of breath especially with exertion along with severe fatigue and lightheadedness.  She denies syncope.  She is having a pulling type chest pain in her left chest and underneath her left breast.  No radiation of symptoms or other modifying factors.   Patient arrived by EMS. They recorded O2 sat of 90% on RA with some mild/mod increased WOB so started 3L [place] en route.   Past Medical History:  Diagnosis Date  . Diabetes mellitus   . Hypertension   . Seizures [facility])     Patient Active Problem List   Diagnosis Date Noted  . Nonepileptic episode (HCC) 01/18/2012  . 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 Diabetes mellitus 01/17/2012  . Hypertension 01/17/2012  . Hyperlipidemia 01/17/2012  . GERD (gastroesophageal reflux disease) 01/17/2012  . Abdominal pain 01/17/2012  . 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/17/2012    Past Surgical History:  Procedure Laterality Date  . GASTRIC BYPASS     one band has broke and was noted on her last colonoscopy    Allergies Aspirin, Demerol, Lisinopril, Losartan, Morphine, Oxycodone, Penicillins, Phentermine, and Trazodone  No family history on file.  Social History Social History   Tobacco Use  . Smoking status: Former Games developer  . Smokeless tobacco: Never Used  Substance Use Topics  . Alcohol use: No  . Drug use: No    Review of Systems  Constitutional: Positive fever,  fatigue.  Eyes: No visual changes. ENT: No sore throat. Cardiovascular: Positive chest pain. Respiratory: Positive shortness of breath. Gastrointestinal: No abdominal pain.  No nausea, no vomiting.  No diarrhea.  No constipation. Genitourinary: Negative for dysuria. Musculoskeletal: Negative for back pain. Skin: Negative for rash. Neurological: Negative for headaches, focal weakness or numbness.  10-point ROS otherwise negative.  ____________________________________________   PHYSICAL EXAM:  VITAL SIGNS: Vitals:   01/20/20 1922  BP: (!) 169/96  Pulse: (!) 104  Resp: (!) 23  Temp: 99.4 F (37.4 C)  SpO2: 98%    Constitutional: Alert and oriented. Well appearing and in no acute distress. Eyes: Conjunctivae are normal.  Head: Atraumatic. Nose: No congestion/rhinnorhea. Mouth/Throat: Mucous membranes are moist. Neck: No stridor.  Cardiovascular: Tachycardia. Good peripheral circulation. Grossly normal heart sounds.   Respiratory: Normal respiratory effort.  No retractions. Lungs CTAB. Gastrointestinal: Soft and nontender. No distention.  Musculoskeletal: No lower extremity tenderness nor edema. No gross deformities of extremities. Tenderness to palpation of the left lateral chest wall. No crepitus or bruising.  Neurologic:  Normal speech and language. No gross focal neurologic deficits are appreciated.  Skin:  Skin is warm, dry and intact. No rash noted.  ____________________________________________   LABS (all labs ordered are listed, but only abnormal results are displayed)  Labs Reviewed  COMPREHENSIVE METABOLIC PANEL - Abnormal; Notable for the following components:      Result Value   Potassium 3.3 (*)    Glucose, Bld 256 (*)  BUN 5 (*)    Calcium 8.4 (*)    Albumin 3.3 (*)    All other components within normal limits  D-DIMER, QUANTITATIVE (NOT AT [facility]) - Abnormal; Notable for the following components:   D-Dimer, Quant 0.96 (*)    All other components  within normal limits  LACTATE DEHYDROGENASE - Abnormal; Notable for the following components:   LDH 255 (*)    All other components within normal limits  FIBRINOGEN - Abnormal; Notable for the following components:   Fibrinogen 553 (*)    All other components within normal limits  C-REACTIVE PROTEIN - Abnormal; Notable for the following components:   CRP 8.6 (*)    All other components within normal limits  CULTURE, BLOOD (ROUTINE X 2)  CULTURE, BLOOD (ROUTINE X 2)  SARS CORONAVIRUS 2 (TAT 6-24 HRS)  LACTIC ACID, PLASMA  CBC WITH DIFFERENTIAL/PLATELET  FERRITIN  TRIGLYCERIDES  PROCALCITONIN  I-STAT BETA HCG BLOOD, ED ([facility], [facility], [facility] ONLY)   ____________________________________________  EKG   EKG Interpretation  Date/Time:  Saturday January 20 2020 19:25:09 EST Ventricular Rate:  106 PR Interval:    QRS Duration: 84 QT Interval:  366 QTC Calculation: 486 R Axis:   36 Text Interpretation: Sinus tachycardia Multiform ventricular premature complexes Borderline prolonged QT interval No STEMI Confirmed by Nanda Quinton [phone]) on 01/20/2020 7:37:48 PM       ____________________________________________  RADIOLOGY  DG Chest Port 1 View  Result Date: 01/20/2020 CLINICAL DATA:  Left lower lung pain for 1 week, fever EXAM: PORTABLE CHEST 1 VIEW COMPARISON:  07/20/2019 FINDINGS: Cardiomediastinal contours are stable accounting for differences in projection. There is signs of basilar airspace disease at the lung bases bilaterally. No signs of acute bone finding. IMPRESSION: Signs of basilar airspace disease may represent infection. Study limited by lordotic portable technique. Electronically Signed   By: Zetta Bills M.D.   On: 01/20/2020 20:44    ____________________________________________   PROCEDURES  Procedure(s) performed:   Procedures  CRITICAL CARE Performed by: Margette Fast Total critical care time: 35 minutes Critical care time was exclusive of separately  billable procedures and treating other patients. Critical care was necessary to treat or prevent imminent or life-threatening deterioration. Critical care was time spent personally by me on the following activities: development of treatment plan with patient and/or surrogate as well as nursing, discussions with consultants, evaluation of patient's response to treatment, examination of patient, obtaining history from patient or surrogate, ordering and performing treatments and interventions, ordering and review of laboratory studies, ordering and review of radiographic studies, pulse oximetry and re-evaluation of patient's condition.  Nanda Quinton, MD Emergency Medicine  ____________________________________________   INITIAL IMPRESSION / ASSESSMENT AND PLAN / ED COURSE  Pertinent labs & imaging results that were available during my care of the patient were reviewed by me and considered in my medical decision making (see chart for details).   Patient presents to the emergency department for evaluation of chest pain and shortness of breath with fatigue in the setting of COVID-19 diagnosis and total of 8 days of symptoms. Patient is looking for COVID result on her phone.  Patient does have mild increased work of breathing with borderline hypoxemia and is currently on 3 L nasal cannula.  Unable to wean the patient down in the room.  Plan for Covid admit labs and reassess but likely admit given symptoms and hypoxemia now.   Labs and imaging reviewed. Plan for admit.   Discussed patient's case with [facility], Dr.  Benita Gutter to request admission. Patient and family (if present) updated with plan. Care transferred to [facility] service.  I reviewed all nursing notes, vitals, pertinent old records, EKGs, labs, imaging (as available).  ____________________________________________  FINAL CLINICAL IMPRESSION(S) / ED DIAGNOSES  Final diagnoses:  Acute respiratory failure with hypoxia (HCC)  COVID-19    Note:  This  document was prepared using Dragon voice recognition software and may include unintentional dictation errors.  Alona Bene, MD, [facility] Emergency Medicine    Miyanna Wiersma, Arlyss Repress, MD 01/20/20 [phone]

## 2020-01-20 NOTE — ED Triage Notes (Signed)
Pt comes to ed via ems, pt was diagnosed covid positive this past Monday per ems. Pt called out ems, c/o of dizziness, and fatigue with fever. Pt walks, alert x 4. Pt hx of diabetes and HTN and asthma. Denies SOB, endorses cough with thick white phlem.  Pt v/s on arrival bp 177/84, hr 100, spo2 97 on 2 liters, rr24, cbg 297, temp 101.3.

## 2020-01-20 NOTE — Progress Notes (Signed)
Lovenox per Pharmacy for DVT Prophylaxis    Pharmacy has been consulted from dosing enoxaparin (lovenox) in this patient for DVT prophylaxis.  The pharmacist has reviewed pertinent labs (Hgb _12.7__; PLT_293__), patient weight (_121__kg) and renal function (CrCl_>90__mL/min) and decided that enoxaparin _60_mg SQ Q24Hrs is appropriate for this patient.  The pharmacy department will sign off at this time.  Please reconsult pharmacy if status changes or for further issues.  Thank you  Luetta Nutting PharmD, BCPS  01/20/2020, 11:57 PM

## 2020-01-20 NOTE — ED Notes (Signed)
Pt received 1/2 a cup of ice

## 2020-01-21 ENCOUNTER — Encounter (HOSPITAL_COMMUNITY): Payer: Self-pay | Admitting: Family Medicine

## 2020-01-21 DIAGNOSIS — I10 Essential (primary) hypertension: Secondary | ICD-10-CM

## 2020-01-21 DIAGNOSIS — I2510 Atherosclerotic heart disease of native coronary artery without angina pectoris: Secondary | ICD-10-CM

## 2020-01-21 DIAGNOSIS — E876 Hypokalemia: Secondary | ICD-10-CM

## 2020-01-21 DIAGNOSIS — E785 Hyperlipidemia, unspecified: Secondary | ICD-10-CM

## 2020-01-21 DIAGNOSIS — R569 Unspecified convulsions: Secondary | ICD-10-CM

## 2020-01-21 DIAGNOSIS — U071 COVID-19: Principal | ICD-10-CM

## 2020-01-21 DIAGNOSIS — I2583 Coronary atherosclerosis due to lipid rich plaque: Secondary | ICD-10-CM

## 2020-01-21 DIAGNOSIS — G4733 Obstructive sleep apnea (adult) (pediatric): Secondary | ICD-10-CM

## 2020-01-21 DIAGNOSIS — E1169 Type 2 diabetes mellitus with other specified complication: Secondary | ICD-10-CM

## 2020-01-21 DIAGNOSIS — J1282 Pneumonia due to coronavirus disease 2019: Secondary | ICD-10-CM

## 2020-01-21 HISTORY — DX: Atherosclerotic heart disease of native coronary artery without angina pectoris: I25.10

## 2020-01-21 LAB — COMPREHENSIVE METABOLIC PANEL
ALT: 30 U/L (ref 0–44)
AST: 29 U/L (ref 15–41)
Albumin: 3.5 g/dL (ref 3.5–5.0)
Alkaline Phosphatase: 119 U/L (ref 38–126)
Anion gap: 11 (ref 5–15)
BUN: 6 mg/dL (ref 6–20)
CO2: 29 mmol/L (ref 22–32)
Calcium: 8.5 mg/dL — ABNORMAL LOW (ref 8.9–10.3)
Chloride: 99 mmol/L (ref 98–111)
Creatinine, Ser: 0.46 mg/dL (ref 0.44–1.00)
GFR calc Af Amer: >60 mL/min (ref >60)
GFR calc non Af Amer: >60 mL/min (ref >60)
Glucose, Bld: 312 mg/dL — ABNORMAL HIGH (ref 70–99)
Potassium: 3.6 mmol/L (ref 3.5–5.1)
Sodium: 139 mmol/L (ref 135–145)
Total Bilirubin: 0.7 mg/dL (ref 0.3–1.2)
Total Protein: 7.3 g/dL (ref 6.5–8.1)

## 2020-01-21 LAB — CBC WITH DIFFERENTIAL/PLATELET
Abs Immature Granulocytes: 0.04 10*3/uL (ref 0.00–0.07)
Basophils Absolute: 0 10*3/uL (ref 0.0–0.1)
Basophils Relative: 0 %
Eosinophils Absolute: 0 10*3/uL (ref 0.0–0.5)
Eosinophils Relative: 0 %
HCT: 39.9 % (ref 36.0–46.0)
Hemoglobin: 12.4 g/dL (ref 12.0–15.0)
Immature Granulocytes: 1 %
Lymphocytes Relative: 15 %
Lymphs Abs: 1.1 10*3/uL (ref 0.7–4.0)
MCH: 27.7 pg (ref 26.0–34.0)
MCHC: 31.1 g/dL (ref 30.0–36.0)
MCV: 89.3 fL (ref 80.0–100.0)
Monocytes Absolute: 0.2 10*3/uL (ref 0.1–1.0)
Monocytes Relative: 3 %
Neutro Abs: 6.1 10*3/uL (ref 1.7–7.7)
Neutrophils Relative %: 81 %
Platelets: 309 10*3/uL (ref 150–400)
RBC: 4.47 MIL/uL (ref 3.87–5.11)
RDW: 13.2 % (ref 11.5–15.5)
WBC: 7.5 10*3/uL (ref 4.0–10.5)
nRBC: 0 % (ref 0.0–0.2)

## 2020-01-21 LAB — GLUCOSE, CAPILLARY
Glucose-Capillary: 299 mg/dL — ABNORMAL HIGH (ref 70–99)
Glucose-Capillary: 354 mg/dL — ABNORMAL HIGH (ref 70–99)
Glucose-Capillary: 341 mg/dL — ABNORMAL HIGH (ref 70–99)
Glucose-Capillary: 372 mg/dL — ABNORMAL HIGH (ref 70–99)

## 2020-01-21 LAB — HIV ANTIBODY (ROUTINE TESTING W REFLEX): HIV Screen 4th Generation wRfx: NONREACTIVE

## 2020-01-21 LAB — ABO/RH: ABO/RH(D): O POS

## 2020-01-21 LAB — SARS CORONAVIRUS 2 (TAT 6-24 HRS): SARS Coronavirus 2: POSITIVE — AB

## 2020-01-21 LAB — D-DIMER, QUANTITATIVE: D-Dimer, Quant: 1.32 ug/mL-FEU — ABNORMAL HIGH (ref 0.00–0.50)

## 2020-01-21 LAB — HEMOGLOBIN A1C
Hgb A1c MFr Bld: 12.4 % — ABNORMAL HIGH (ref 4.8–5.6)
Mean Plasma Glucose: 309.18 mg/dL

## 2020-01-21 LAB — C-REACTIVE PROTEIN: CRP: 8.3 mg/dL — ABNORMAL HIGH (ref <1.0)

## 2020-01-21 MED ORDER — ASCORBIC ACID 500 MG PO TABS
500.0000 mg | ORAL_TABLET | Freq: Every day | ORAL | Status: DC
  Administered 2020-01-21 – 2020-01-25 (×5): 500 mg via ORAL
  Filled 2020-01-21 (×5): qty 1

## 2020-01-21 MED ORDER — PANTOPRAZOLE SODIUM 40 MG IV SOLR
40.0000 mg | INTRAVENOUS | Status: DC
  Administered 2020-01-22 – 2020-01-24 (×3): 40 mg via INTRAVENOUS
  Filled 2020-01-21 (×3): qty 40

## 2020-01-21 MED ORDER — GUAIFENESIN-DM 100-10 MG/5ML PO SYRP
10.0000 mL | ORAL_SOLUTION | ORAL | Status: DC | PRN
  Administered 2020-01-21: 10 mL via ORAL
  Filled 2020-01-21: qty 10

## 2020-01-21 MED ORDER — ADULT MULTIVITAMIN W/MINERALS CH
1.0000 | ORAL_TABLET | Freq: Every day | ORAL | Status: DC
  Administered 2020-01-21 – 2020-01-25 (×5): 1 via ORAL
  Filled 2020-01-21 (×5): qty 1

## 2020-01-21 MED ORDER — DILTIAZEM HCL ER COATED BEADS 120 MG PO CP24
120.0000 mg | ORAL_CAPSULE | Freq: Every day | ORAL | Status: DC
  Administered 2020-01-21 – 2020-01-24 (×4): 120 mg via ORAL
  Filled 2020-01-21 (×5): qty 1

## 2020-01-21 MED ORDER — ZINC SULFATE 220 (50 ZN) MG PO CAPS
220.0000 mg | ORAL_CAPSULE | Freq: Every day | ORAL | Status: DC
  Administered 2020-01-21 – 2020-01-25 (×5): 220 mg via ORAL
  Filled 2020-01-21 (×5): qty 1

## 2020-01-21 MED ORDER — ATORVASTATIN CALCIUM 20 MG PO TABS
20.0000 mg | ORAL_TABLET | Freq: Every day | ORAL | Status: DC
  Administered 2020-01-21 – 2020-01-25 (×5): 20 mg via ORAL
  Filled 2020-01-21 (×5): qty 1

## 2020-01-21 MED ORDER — HYDROCOD POLST-CPM POLST ER 10-8 MG/5ML PO SUER: 5.0000 mL | Freq: Two times a day (BID) | ORAL | Status: DC | PRN

## 2020-01-21 MED ORDER — SODIUM CHLORIDE 0.9 % IV SOLN
100.0000 mg | INTRAVENOUS | Status: AC
  Administered 2020-01-21 (×2): 100 mg via INTRAVENOUS
  Filled 2020-01-21: qty 100
  Filled 2020-01-21: qty 20

## 2020-01-21 MED ORDER — POTASSIUM CHLORIDE CRYS ER 20 MEQ PO TBCR
20.0000 meq | EXTENDED_RELEASE_TABLET | Freq: Every day | ORAL | Status: DC
  Administered 2020-01-21 – 2020-01-25 (×5): 20 meq via ORAL
  Filled 2020-01-21 (×5): qty 1

## 2020-01-21 MED ORDER — HYDRALAZINE HCL 20 MG/ML IJ SOLN
5.0000 mg | Freq: Four times a day (QID) | INTRAMUSCULAR | Status: DC | PRN
  Administered 2020-01-21 – 2020-01-24 (×6): 5 mg via INTRAVENOUS
  Filled 2020-01-21 (×6): qty 1

## 2020-01-21 MED ORDER — SODIUM CHLORIDE 0.9 % IV SOLN
100.0000 mg | Freq: Every day | INTRAVENOUS | Status: AC
  Administered 2020-01-22 – 2020-01-25 (×4): 100 mg via INTRAVENOUS
  Filled 2020-01-21 (×4): qty 20

## 2020-01-21 NOTE — Plan of Care (Signed)

## 2020-01-21 NOTE — Progress Notes (Signed)
PROGRESS NOTE  Sierra Ellis  DOB: [birth year]  PCP: Patient, No Pcp Per HER:[phone]  DOA: 01/20/2020 Admitted From: Home  LOS: 1 day   Chief Complaint  Patient presents with  . Fatigue  . Dizziness   Brief narrative: Sierra Ellis is a 59 y.o. female with medical history significant for seizures, insulin-dependent type 2 diabetes, CAD, hypertension, OSA not on CPAP, and vertigo who presented to the ED on 2/27 with concerns of worsening headache and dizziness. Patient had tested positive for Covid back on 2/22 at an outpatient clinic.  Since then she has been having chills, headache, dizziness, decreased appetite, loss of smell and taste.  In the ED, temperature was 90.9, tachycardic and tachypneic requiring 2 L via nasal cannula. Lab work showed no leukocytosis.   Chest x-ray showed signs of bibasilar airspace disease that may represent infection.  Subjective: Patient was seen and examined this morning.  Pleasant middle-aged African-American female. Feels better than at presentation. Using incentive spirometry.  Assessment/Plan: COVID pneumonia Acute respiratory failure with hypoxia 2 L -Presented with Covid positivity, shortness of breath -COVID test: Positive Covid antigen on 2/22 at an outpatient clinic -Chest imaging -bibasilar airspace disease -Treatment: Decadron 6 mg daily for 10 days, IV Remdesivir for 5 days to complete on 3/4, -Supportive care: Vitamin C, Zinc, inhalers, Tylenol, Antitussives - benzonatate, Mucinex.   -IV Protonix while on IV Decadron. -Progression: Feels better than at presentation. -Oxygen - SpO2: 95 % O2 Flow Rate (L/min): 2 L/min -Continue airborne/contact isolation precautions. -WBC and inflammatory markers trend as below.  Lab Results  Component Value Date   SARSCOV2NAA POSITIVE (A) 01/20/2020    Recent Labs  Lab 01/20/20 1926 01/21/20 0402  WBC 6.3 7.5   Recent Labs    01/20/20 1926 01/21/20 0402  DDIMER 0.96* 1.32*    FERRITIN 171  --   LDH 255*  --   CRP 8.6* 8.3*   Cardiovascular issues: HTN, HLD, CAD -Home meds include Cardizem, Lasix, statin, -Continue Cardizem, statin.  Lasix on hold.  -Blood pressure seems to be significantly elevated this morning.  I will resume Lasix. Creatinine normal.  -Monitor blood pressure, creatinine, potassium level.  - Hydralazine IV as needed  Insulin-dependent type 2 diabetes -Home meds include Lantus 25 units at bedtime, Metformin. -continue 24 units of Lantus from home. Continue sliding scale insulin with Accu-Cheks while on steroids.   Seizures Not on any antiepileptics.  Has been stable for many years.  Morbid obesity - Body mass index is 41.97 kg/m. Patient has been advised to make an attempt to improve diet and exercise patterns to aid in weight loss.  OSA  not on CPAP  DVT prophylaxis:  Lovenox Antimicrobials:  IV remdesivir Fluid: None Diet: Cardiac/diabetic diet  Code Status:  Full code Mobility: Encourage ambulation Family Communication:  Discharge plan:  Anticipated date: 3/3 Disposition: Home Barriers: IV remdesivir.  Clinically not stable for discharge at this time  Consultants:  None  Antimicrobials: Anti-infectives (From admission, onward)   Start     Dose/Rate Route Frequency Ordered Stop   01/22/20 1000  remdesivir 100 mg in sodium chloride 0.9 % 100 mL IVPB     100 mg 200 mL/hr over 30 Minutes Intravenous Daily 01/21/20 0000 01/26/20 0959   01/21/20 0030  remdesivir 100 mg in sodium chloride 0.9 % 100 mL IVPB     100 mg 200 mL/hr over 30 Minutes Intravenous Every 30 min 01/21/20 0000 01/21/20 0509  Code Status: Full Code   Diet Order            Diet Heart Room service appropriate? Yes; Fluid consistency: Thin  Diet effective now              Infusions:  . [START ON 01/22/2020] remdesivir 100 mg in NS 100 mL      Scheduled Meds: . vitamin C  500 mg Oral Daily  . atorvastatin  20 mg Oral Daily  .  dexamethasone (DECADRON) injection  6 mg Intravenous QHS  . diltiazem  120 mg Oral Daily  . enoxaparin (LOVENOX) injection  60 mg Subcutaneous QHS  . insulin aspart  0-20 Units Subcutaneous TID WC  . insulin glargine  24 Units Subcutaneous QHS  . multivitamin with minerals  1 tablet Oral Daily  . [START ON 01/22/2020] pantoprazole (PROTONIX) IV  40 mg Intravenous Q24H  . potassium chloride SA  20 mEq Oral Daily  . zinc sulfate  220 mg Oral Daily    PRN meds: chlorpheniramine-HYDROcodone, guaiFENesin-dextromethorphan, hydrALAZINE   Objective: Vitals:   01/21/20 0800 01/21/20 1208  BP: (!) 181/92 (!) 177/92  Pulse:  100  Resp:  19  Temp:  97.9 F (36.6 C)  SpO2:  95%    Intake/Output Summary (Last 24 hours) at 01/21/2020 1316 Last data filed at 01/21/2020 0439 Gross per 24 hour  Intake 200 ml  Output --  Net 200 ml   Filed Weights   01/20/20 1922  Weight: 121.6 kg   Weight change:  Body mass index is 41.97 kg/m.   Physical Exam: General exam: Appears calm and comfortable.  Not in distress at this time Skin: No rashes, lesions or ulcers. HEENT: Atraumatic, normocephalic, supple neck, no obvious bleeding Lungs: Minimal bilateral scattered rales CVS: Regular rate and rhythm, no murmur GI/Abd soft, nontender, nondistended, bowel sound present CNS: Alert, awake, oriented x3 Psychiatry: Mood appropriate Extremities: No pedal edema, no calf tenderness  Data Review: I have personally reviewed the laboratory data and studies available.  Recent Labs  Lab 01/20/20 1926 01/21/20 0402  WBC 6.3 7.5  NEUTROABS 4.6 6.1  HGB 12.7 12.4  HCT 41.0 39.9  MCV 88.6 89.3  PLT 293 309   Recent Labs  Lab 01/20/20 1926 01/21/20 0402  NA 138 139  K 3.3* 3.6  CL 100 99  CO2 29 29  GLUCOSE 256* 312*  BUN 5* 6  CREATININE 0.44 0.46  CALCIUM 8.4* 8.5*    Signed, Terrilee Croak, MD [facility] Pager: [phone] (Secure Chat  preferred). 01/21/2020

## 2020-01-21 NOTE — Plan of Care (Signed)
Pt admitted to 4w w/ a dx of covid 19. Pt a+ox4,VSS,SR on 2LO2/[place]. Pt is independent with ADL's. Pt oriented to room and floor routine. POC reviewed with understanding verbalized.Will continue to monitor per on coming RN  Problem: Education: Goal: Knowledge of General Education information will improve Description: Including pain rating scale, medication(s)/side effects and non-pharmacologic comfort measures 01/21/2020 0630 by Alfonse Spruce, RN Outcome: Progressing   Problem: Health Behavior/Discharge Planning: Goal: Ability to manage health-related needs will improve 01/21/2020 0630 by Alfonse Spruce, RN Outcome: ProgressinG   Problem: Clinical Measurements: Goal: Ability to maintain clinical measurements within normal limits will improve 01/21/2020 0630 by Alfonse Spruce, RN Outcome: Progressing Goal: Will remain free from infection 01/21/2020 0630 by Alfonse Spruce, RN Outcome: Progressing Goal: Diagnostic test results will improve 01/21/2020 0630 by Alfonse Spruce, RN Outcome: Progressing Goal: Respiratory complications will improve 01/21/2020 0630 by Alfonse Spruce, RN Outcome: Progressing     Goal: Cardiovascular complication will be avoided 01/21/2020 0630 by Alfonse Spruce, RN Outcome: Progressing Problem: Activity: Goal: Risk for activity intolerance will decrease 01/21/2020 0630 by Alfonse Spruce, RN Outcome: Progressing   Problem: Nutrition: Goal: Adequate nutrition will be maintained 01/21/2020 0630 by Alfonse Spruce, RN Outcome: Progressing   Problem: Coping: Goal: Level of anxiety will decrease 01/21/2020 0630 by Alfonse Spruce, RN Outcome: Progressing   Problem: Elimination: Goal: Will not experience complications related to bowel motility 01/21/2020 0630 by Alfonse Spruce, RN Outcome: Progressing 01/21/2020 0629 by Alfonse Spruce, RN Outcome: Progressing Goal: Will not experience complications related to urinary retention 01/21/2020 0630 by Alfonse Spruce, RN Outcome: Progressing   Problem: Pain Managment: Goal: General experience of comfort will improve 01/21/2020 0630 by Alfonse Spruce, RN Outcome: Progressing   Problem: Safety: Goal: Ability to remain free from injury will improve 01/21/2020 0630 by Alfonse Spruce, RN Outcome: Progressing   Problem: Skin Integrity: Goal: Risk for impaired skin integrity will decrease 01/21/2020 0630 by Alfonse Spruce, RN Outcome: Progressing

## 2020-01-21 NOTE — H&P (Signed)
History and Physical    Sierra Ellis BMW:[id number] DOB: 1961-09-12 DOA: 01/20/2020  PCP: Patient, No Pcp Per  Patient coming from: Home, lives with her 2 children  I have personally briefly reviewed patient's old medical records in [place]  Chief Complaint: Dizziness, body ache  HPI: Sierra Ellis is a 59 y.o. female with medical history significant for seizures, insulin-dependent type 2 diabetes, CAD, hypertension, OSA not on CPAP, and vertigo who presents with concerns of worsening headache and dizziness. Patient was tested positive for Covid back on 2/22 at an outpatient clinic.  Since then she has been having chills, decreased appetite, loss of smell and taste.  Also has had headache and dizziness.  Dizziness was acutely worse today and felt like she "could not think" and had blurry speech.  Felt some mild shortness of breath and body ache.  She has been vomiting some phlegm.  Has had mild diarrhea. Unsure where she contracted Covid but has been going to stores for groceries.  Children at home are negative for Covid.  ED Course:  She had temperature of 90 9.42F, tachycardic and tachypneic requiring 2 L via nasal cannula. Lab work showed no leukocytosis.  Potassium 3.3.  Glucose of 256.  Normal creatinine of 0.44.  LDH is elevated to 55, CRP of 8.6, lactic acid 1.2, procalcitonin 0.10.  Chest x-ray shows signs of bibasilar airspace disease that may represent infection.  Review of Systems:  Constitutional: No Weight Change, No Fever ENT/Mouth: No sore throat, No Rhinorrhea Eyes: No Eye Pain, No Vision Changes Cardiovascular: No Chest Pain, +SOB Respiratory: + Cough, + Sputum, No Wheezing, no Dyspnea  Gastrointestinal:+ Nausea, + Vomiting, No Diarrhea, No Constipation, No Pain Genitourinary: no Urinary Incontinence, No Urgency, No Flank Pain Musculoskeletal: No Arthralgias, No Myalgias Skin: No Skin Lesions, No Pruritus, Neuro: no Weakness, No Numbness,  No Loss of  Consciousness, No Syncope Psych: No Anxiety/Panic, No Depression, +decrease appetite Heme/Lymph: No Bruising, No Bleeding  Past Medical History:  Diagnosis Date  . Diabetes mellitus   . Hypertension   . Seizures ([place])     Past Surgical History:  Procedure Laterality Date  . GASTRIC BYPASS     one band has broke and was noted on her last colonoscopy     reports that she has quit smoking. She has never used smokeless tobacco. She reports that she does not drink alcohol or use drugs.  Allergies  Allergen Reactions  . Aspirin Other (See Comments)    Seizures.   . Demerol Other (See Comments)    Seizures.   . Lisinopril Cough  . Losartan     Sneezing and coughing  . Morphine Other (See Comments)    Seizures  . Oxycodone Other (See Comments)    seizures  . Penicillins Other (See Comments)    Seizures.Has patient had a PCN reaction causing immediate rash, facial/tongue/throat swelling, SOB or lightheadedness with hypotension: Yes Has patient had a PCN reaction causing severe rash involving mucus membranes or skin necrosis: Yes Has patient had a PCN reaction that required hospitalizationYes Has patient had a PCN reaction occurring within the last 10 years: Yes If all of the above answers are "NO", then may proceed with Cephalosporin use.   Marland Kitchen Phentermine     History of seizure disorder  . Trazodone Other (See Comments)    Hallucinations       Prior to Admission medications   Medication Sig Start Date End Date Taking? Authorizing Provider  atorvastatin (LIPITOR) 20  MG tablet Take 20 mg by mouth daily. 01/02/20  Yes [provider]  cholecalciferol (VITAMIN D3) 25 MCG (1000 UT) tablet Take 1,000 Units by mouth daily.   Yes [provider]  Dextromethorphan Polistirex (DELSYM PO) Take 1 tablet by mouth daily as needed (cough).   Yes [provider]  diltiazem (CARDIZEM CD) 120 MG 24 hr capsule Take 120 mg by mouth daily. 01/02/20  Yes [provider]  furosemide (LASIX) 20 MG tablet Take 20 mg by mouth 2 (two) times daily as needed. 01/02/20  Yes [provider]  KLOR-CON M20 20 MEQ tablet Take 20 mEq by mouth daily. 01/02/20  Yes [provider]  LANTUS SOLOSTAR 100 UNIT/ML Solostar Pen Inject 24 Units into the skin at bedtime.  01/02/20  Yes [provider]  meclizine (ANTIVERT) 25 MG tablet Take 1 tablet (25 mg total) by mouth 3 (three) times daily as needed for dizziness. 07/20/19  Yes Petrucelli, Samantha R, PA-C  metFORMIN (GLUCOPHAGE) 500 MG tablet Take 500-1,500 mg by mouth See admin instructions. 500mg  in AM and 1500mg  qhs   Yes [provider]  celecoxib (CELEBREX) 200 MG capsule Take 1 capsule (200 mg total) by mouth 2 (two) times daily. Patient not taking: Reported on 07/20/2019 08/31/18   07/22/2019, MD  fluconazole (DIFLUCAN) 150 MG tablet Take 1 tablet today and repeat in 72 hours if your genital rash has not resolved Patient not taking: Reported on 01/20/2020 07/20/19   Petrucelli, 01/22/2020, PA-C  losartan (COZAAR) 50 MG tablet Take 1 tablet (50 mg total) by mouth daily. Patient not taking: Reported on 08/31/2018 01/18/12 08/31/18  01/20/12, MD  metFORMIN (GLUCOPHAGE) 1000 MG tablet Take 1 tablet (1,000 mg total) by mouth daily with breakfast. Patient not taking: Reported on 08/31/2018 01/18/12 08/31/18  01/20/12, MD  metFORMIN (GLUCOPHAGE) 1000 MG tablet Take 1 tablet (1,000 mg total) by mouth 2 (two) times daily with a meal. Patient not taking: Reported on 08/31/2018 01/18/12 01/17/13  01/20/12, MD  metFORMIN (GLUCOPHAGE) 1000 MG tablet Take 1 tablet (1,000 mg total) by mouth daily with breakfast. Patient not taking: Reported on 08/31/2018 01/18/12 01/17/13  01/20/12, MD  metFORMIN (GLUCOPHAGE) 1000 MG tablet Take 1 tablet (1,000 mg total) by mouth 2 (two) times daily with a meal. Patient not taking: Reported on 08/31/2018 01/18/12 01/17/13  01/20/12, MD   metFORMIN (GLUCOPHAGE) 500 MG tablet Take 1 tablet (500 mg total) by mouth daily with supper. Patient not taking: Reported on 08/31/2018 01/18/12 01/17/13  01/20/12, MD  metFORMIN (GLUCOPHAGE) 500 MG tablet Take 1 tablet (500 mg total) by mouth daily with supper. Patient not taking: Reported on 08/31/2018 01/18/12 01/17/13  01/20/12, MD  nystatin cream (MYCOSTATIN) Apply to affected area 2 times daily, do not insert into vagina 07/20/19   Petrucelli, Samantha R, PA-C  traZODone (DESYREL) 50 MG tablet Take 1 tablet (50 mg total) by mouth at bedtime. Patient not taking: Reported on 08/31/2018 01/18/12 02/17/12  01/20/12, MD    Physical Exam: Vitals:   01/20/20 1922 01/20/20 2212  BP: (!) 169/96 (!) 172/91  Pulse: (!) 104 (!) 108  Resp: (!) 23 (!) 21  Temp: 99.4 F (37.4 C)   TempSrc: Oral   SpO2: 98% 95%  Weight: 121.6 kg   Height: 5\' 7"  (1.702 m)     Constitutional: NAD, calm, comfortable, obese female laying in bed at 40 degree incline asleep. Vitals:   01/20/20 1922  01/20/20 2212  BP: (!) 169/96 (!) 172/91  Pulse: (!) 104 (!) 108  Resp: (!) 23 (!) 21  Temp: 99.4 F (37.4 C)   TempSrc: Oral   SpO2: 98% 95%  Weight: 121.6 kg   Height: 5\' 7"  (1.702 m)    Eyes: PERRL, lids and conjunctivae normal ENMT: Mucous membranes are moist.  Neck: normal, supple Respiratory: bibasilar crackles but no wheezing. Normal respiratory effort on 3L. No accessory muscle use.  Cardiovascular: Regular rate and rhythm, no murmurs / rubs / gallops. No extremity edema.  Abdomen: mild epigastric tenderness without rebound, rigidity or guarding, no masses palpated.  Bowel sounds positive.  Musculoskeletal: no clubbing / cyanosis. No joint deformity upper and lower extremities. Good ROM, no contractures. Normal muscle tone.  Skin: no rashes, lesions, ulcers. No induration Neurologic: CN 2-12 grossly intact. Sensation intact. Strength 5/5 in all 4.  Psychiatric: Normal judgment and  insight. Alert and oriented x 3. Normal mood.    Labs on Admission: I have personally reviewed following labs and imaging studies  CBC: Recent Labs  Lab 01/20/20 1926  WBC 6.3  NEUTROABS 4.6  HGB 12.7  HCT 41.0  MCV 88.6  PLT 293   Basic Metabolic Panel: Recent Labs  Lab 01/20/20 1926  NA 138  K 3.3*  CL 100  CO2 29  GLUCOSE 256*  BUN 5*  CREATININE 0.44  CALCIUM 8.4*   GFR: Estimated Creatinine Clearance: 104.8 mL/min (by C-G formula based on SCr of 0.44 mg/dL). Liver Function Tests: Recent Labs  Lab 01/20/20 1926  AST 31  ALT 32  ALKPHOS 118  BILITOT 0.9  PROT 7.4  ALBUMIN 3.3*   No results for input(s): LIPASE, AMYLASE in the last 168 hours. No results for input(s): AMMONIA in the last 168 hours. Coagulation Profile: No results for input(s): INR, PROTIME in the last 168 hours. Cardiac Enzymes: No results for input(s): CKTOTAL, CKMB, CKMBINDEX, TROPONINI in the last 168 hours. BNP (last 3 results) No results for input(s): PROBNP in the last 8760 hours. HbA1C: No results for input(s): HGBA1C in the last 72 hours. CBG: No results for input(s): GLUCAP in the last 168 hours. Lipid Profile: Recent Labs    01/20/20 1926  TRIG 91   Thyroid Function Tests: No results for input(s): TSH, T4TOTAL, FREET4, T3FREE, THYROIDAB in the last 72 hours. Anemia Panel: Recent Labs    01/20/20 1926  FERRITIN 171   Urine analysis:    Component Value Date/Time   COLORURINE YELLOW 07/20/2019 1003   APPEARANCEUR CLEAR 07/20/2019 1003   LABSPEC 1.037 (H) 07/20/2019 1003   PHURINE 5.0 07/20/2019 1003   GLUCOSEU >=500 (A) 07/20/2019 1003   HGBUR NEGATIVE 07/20/2019 1003   BILIRUBINUR NEGATIVE 07/20/2019 1003   KETONESUR 20 (A) 07/20/2019 1003   PROTEINUR NEGATIVE 07/20/2019 1003   UROBILINOGEN 1.0 01/17/2012 1641   NITRITE NEGATIVE 07/20/2019 1003   LEUKOCYTESUR MODERATE (A) 07/20/2019 1003    Radiological Exams on Admission: DG Chest Port 1 View  Result  Date: 01/20/2020 CLINICAL DATA:  Left lower lung pain for 1 week, fever EXAM: PORTABLE CHEST 1 VIEW COMPARISON:  07/20/2019 FINDINGS: Cardiomediastinal contours are stable accounting for differences in projection. There is signs of basilar airspace disease at the lung bases bilaterally. No signs of acute bone finding. IMPRESSION: Signs of basilar airspace disease may represent infection. Study limited by lordotic portable technique. Electronically Signed   By: 07/22/2019 M.D.   On: 01/20/2020 20:44    EKG: Independently reviewed.  Assessment/Plan  Acute hypoxic respiratory failure secondary to COVID pneumonia On 3L  Maintain O2 > 92%  IV Decadron  Remdesivir Monitor inflammatory markers  hypokalemia   replete and continue home potassium  Seizures Not on any antiepileptics.  Has been stable for many years.  insulin-dependent type 2 diabetes Continue home 24 units of Lantus and place on resisting sliding scale while on steroids  History of CAD Asymptomatic  Hypertension Continue diltiazem  Hyperlipidemia Continue statin   OSA  not on CPAP  DVT prophylaxis:.Lovenox Code Status: Full Family Communication: Plan discussed with patient at bedside and at length with family member Cailyn Houdek. All questions and concerns were addressed. disposition Plan: Home with at least 2 midnight stays  Consults called:  Admission status: inpatient    Keleigh Kazee T Lasheena Frieze DO [facility]   If 7PM-7AM, please contact night-coverage [email or web address]   01/21/2020, 12:01 AM

## 2020-01-21 NOTE — Progress Notes (Signed)
Pharmacy: Remdesivir   Patient is a 59 y.o. female with COVID.  Pharmacy has been consulted for remdesivir dosing.   - CXR shows "Signs of basilar airspace disease may represent infection. Study limited by lordotic portable technique."  - Pt requiring supplemental oxygen (Yes, 2L [place])  - ALT 32    A/P:  - Patient meets criteria for remdesivir. Will initiate remdesivir 200 mg once followed by 100 mg daily x 4 days.  - Daily CMET while on remdesivir - Will f/u pt's ALT and clinical condition

## 2020-01-22 LAB — CBC WITH DIFFERENTIAL/PLATELET
Abs Immature Granulocytes: 0.06 10*3/uL (ref 0.00–0.07)
Basophils Absolute: 0 10*3/uL (ref 0.0–0.1)
Basophils Relative: 0 %
Eosinophils Absolute: 0.1 10*3/uL (ref 0.0–0.5)
Eosinophils Relative: 2 %
HCT: 42.8 % (ref 36.0–46.0)
Hemoglobin: 13.1 g/dL (ref 12.0–15.0)
Immature Granulocytes: 1 %
Lymphocytes Relative: 24 %
Lymphs Abs: 1.4 10*3/uL (ref 0.7–4.0)
MCH: 27.1 pg (ref 26.0–34.0)
MCHC: 30.6 g/dL (ref 30.0–36.0)
MCV: 88.4 fL (ref 80.0–100.0)
Monocytes Absolute: 0.2 10*3/uL (ref 0.1–1.0)
Monocytes Relative: 4 %
Neutro Abs: 3.8 10*3/uL (ref 1.7–7.7)
Neutrophils Relative %: 69 %
Platelets: 327 10*3/uL (ref 150–400)
RBC: 4.84 MIL/uL (ref 3.87–5.11)
RDW: 13.2 % (ref 11.5–15.5)
WBC: 5.6 10*3/uL (ref 4.0–10.5)
nRBC: 0 % (ref 0.0–0.2)

## 2020-01-22 LAB — COMPREHENSIVE METABOLIC PANEL
ALT: 30 U/L (ref 0–44)
AST: 22 U/L (ref 15–41)
Albumin: 3.3 g/dL — ABNORMAL LOW (ref 3.5–5.0)
Alkaline Phosphatase: 122 U/L (ref 38–126)
Anion gap: 10 (ref 5–15)
BUN: 10 mg/dL (ref 6–20)
CO2: 30 mmol/L (ref 22–32)
Calcium: 9.1 mg/dL (ref 8.9–10.3)
Chloride: 98 mmol/L (ref 98–111)
Creatinine, Ser: 0.45 mg/dL (ref 0.44–1.00)
GFR calc Af Amer: >60 mL/min (ref >60)
GFR calc non Af Amer: >60 mL/min (ref >60)
Glucose, Bld: 340 mg/dL — ABNORMAL HIGH (ref 70–99)
Potassium: 4 mmol/L (ref 3.5–5.1)
Sodium: 138 mmol/L (ref 135–145)
Total Bilirubin: 0.6 mg/dL (ref 0.3–1.2)
Total Protein: 7.8 g/dL (ref 6.5–8.1)

## 2020-01-22 LAB — GLUCOSE, CAPILLARY
Glucose-Capillary: 362 mg/dL — ABNORMAL HIGH (ref 70–99)
Glucose-Capillary: 360 mg/dL — ABNORMAL HIGH (ref 70–99)
Glucose-Capillary: 352 mg/dL — ABNORMAL HIGH (ref 70–99)
Glucose-Capillary: 250 mg/dL — ABNORMAL HIGH (ref 70–99)

## 2020-01-22 LAB — C-REACTIVE PROTEIN: CRP: 5.5 mg/dL — ABNORMAL HIGH (ref <1.0)

## 2020-01-22 LAB — FERRITIN: Ferritin: 208 ng/mL (ref 11–307)

## 2020-01-22 LAB — D-DIMER, QUANTITATIVE: D-Dimer, Quant: 0.73 ug/mL-FEU — ABNORMAL HIGH (ref 0.00–0.50)

## 2020-01-22 MED ORDER — FUROSEMIDE 20 MG PO TABS
20.0000 mg | ORAL_TABLET | Freq: Two times a day (BID) | ORAL | Status: DC
  Administered 2020-01-22 – 2020-01-25 (×7): 20 mg via ORAL
  Filled 2020-01-22 (×7): qty 1

## 2020-01-22 MED ORDER — INSULIN ASPART 100 UNIT/ML ~~LOC~~ SOLN
0.0000 [IU] | Freq: Every day | SUBCUTANEOUS | Status: DC
  Administered 2020-01-22 – 2020-01-23 (×2): 2 [IU] via SUBCUTANEOUS

## 2020-01-22 MED ORDER — INSULIN ASPART 100 UNIT/ML ~~LOC~~ SOLN
5.0000 [IU] | Freq: Three times a day (TID) | SUBCUTANEOUS | Status: DC
  Administered 2020-01-22 – 2020-01-23 (×5): 5 [IU] via SUBCUTANEOUS

## 2020-01-22 MED ORDER — INSULIN GLARGINE 100 UNIT/ML ~~LOC~~ SOLN
30.0000 [IU] | Freq: Every day | SUBCUTANEOUS | Status: DC
  Administered 2020-01-22 – 2020-01-24 (×3): 30 [IU] via SUBCUTANEOUS
  Filled 2020-01-22 (×4): qty 0.3

## 2020-01-22 NOTE — Progress Notes (Signed)
PROGRESS NOTE  Sierra Ellis  DOB: Nov 18, 1961  PCP: Patient, No Pcp Per OEV:[id number]  DOA: 01/20/2020 Admitted From: Home  LOS: 2 days   Chief Complaint  Patient presents with  . Fatigue  . Dizziness   Brief narrative: Sierra Ellis is a 59 y.o. female with medical history significant for seizures, insulin-dependent type 2 diabetes, CAD, hypertension, OSA not on CPAP, and vertigo who presented to the ED on 2/27 with concerns of worsening headache and dizziness. Patient had tested positive for Covid back on 2/22 at an outpatient clinic.  Since then she has been having chills, headache, dizziness, decreased appetite, loss of smell and taste.  In the ED, temperature was 90.9, tachycardic and tachypneic requiring 2 L via nasal cannula. Lab work showed no leukocytosis.   Chest x-ray showed signs of bibasilar airspace disease that may represent infection.  Subjective: Patient was seen and examined this morning.  Pleasant middle-aged African-American female. Feels better than at presentation. Respiratory status improving.  Blood sugar mostly elevated over 300 while on steroids Blood pressure trending up as well.  Assessment/Plan: COVID pneumonia Acute respiratory failure with hypoxia 2 L -Presented with Covid positivity, shortness of breath -COVID test: Positive Covid antigen on 2/22 at an outpatient clinic -Chest imaging -bibasilar airspace disease -Treatment: Decadron 6 mg daily for 10 days, IV Remdesivir for 5 days to complete on 3/4, -Supportive care: Vitamin C, Zinc, inhalers, Tylenol, Antitussives - benzonatate, Mucinex.   -IV Protonix while on IV Decadron. -Progression: Feels better than at presentation.  Respiratory status improving.  On room air.  Inflammatory markers all improving as well. -Oxygen - SpO2: 94 % O2 Flow Rate (L/min): 2 L/min -Continue airborne/contact isolation precautions. -WBC and inflammatory markers trend as below.   Lab Results  Component Value  Date   SARSCOV2NAA POSITIVE (A) 01/20/2020    Recent Labs  Lab 01/20/20 1926 01/21/20 0402 01/22/20 0342  WBC 6.3 7.5 5.6   Recent Labs    01/20/20 1926 01/21/20 0402 01/22/20 0342  DDIMER 0.96* 1.32* 0.73*  FERRITIN 171  --  208  LDH 255*  --   --   CRP 8.6* 8.3* 5.5*   Cardiovascular issues: HTN, HLD, CAD -Home meds include Cardizem, Lasix, statin. -Blood pressure trending up.  Continue Cardizem.  Resume Lasix today.  Continue statin. -Monitor creatinine, blood pressure trend. -Hydralazine IV as needed  Insulin-dependent type 2 diabetes -Home meds include Lantus 25 units at bedtime, Metformin. -Currently on 24 units of Lantus from home.  Blood sugar mostly elevated over 300 because of steroids.  Increased Lantus dose to 30 units today.  Also started on scheduled Premeal 5 units NovoLog.  Diet changed to modified carb.  Continue to monitor Accu-Cheks with sliding schedule.    Seizures Not on any antiepileptics.  Has been stable for many years.  Morbid obesity - Body mass index is 41.97 kg/m. Patient has been advised to make an attempt to improve diet and exercise patterns to aid in weight loss.  OSA  not on CPAP  DVT prophylaxis:  Lovenox Antimicrobials:  IV remdesivir Fluid: None Diet: Cardiac/diabetic diet  Code Status:  Full code Mobility: Encourage ambulation Family Communication:  Discharge plan:  Anticipated date: 3/3 Disposition: Home Barriers: IV remdesivir.  Patient states that he does not have a ride to come back daily to infusion clinic  Consultants:  None  Antimicrobials: Anti-infectives (From admission, onward)   Start     Dose/Rate Route Frequency Ordered Stop   01/22/20 1000  remdesivir 100 mg in sodium chloride 0.9 % 100 mL IVPB     100 mg 200 mL/hr over 30 Minutes Intravenous Daily 01/21/20 0000 01/26/20 0959   01/21/20 0030  remdesivir 100 mg in sodium chloride 0.9 % 100 mL IVPB     100 mg 200 mL/hr over 30 Minutes Intravenous Every  30 min 01/21/20 0000 01/21/20 5009        Code Status: Full Code   Diet Order            Diet heart healthy/carb modified Room service appropriate? Yes; Fluid consistency: Thin  Diet effective now              Infusions:  . remdesivir 100 mg in NS 100 mL 100 mg (01/22/20 0932)    Scheduled Meds: . vitamin C  500 mg Oral Daily  . atorvastatin  20 mg Oral Daily  . dexamethasone (DECADRON) injection  6 mg Intravenous QHS  . diltiazem  120 mg Oral Daily  . enoxaparin (LOVENOX) injection  60 mg Subcutaneous QHS  . furosemide  20 mg Oral BID  . insulin aspart  0-20 Units Subcutaneous TID WC  . insulin aspart  0-5 Units Subcutaneous QHS  . insulin aspart  5 Units Subcutaneous TID WC  . insulin glargine  30 Units Subcutaneous QHS  . multivitamin with minerals  1 tablet Oral Daily  . pantoprazole (PROTONIX) IV  40 mg Intravenous Q24H  . potassium chloride SA  20 mEq Oral Daily  . zinc sulfate  220 mg Oral Daily    PRN meds: chlorpheniramine-HYDROcodone, guaiFENesin-dextromethorphan, hydrALAZINE   Objective: Vitals:   01/22/20 0457 01/22/20 1250  BP: (!) 161/82 (!) 156/87  Pulse: 83 94  Resp: 16 17  Temp: 98 F (36.7 C) 97.7 F (36.5 C)  SpO2:  94%   No intake or output data in the 24 hours ending 01/22/20 1653 Filed Weights   01/20/20 1922  Weight: 121.6 kg   Weight change:  Body mass index is 41.97 kg/m.   Physical Exam: General exam: Appears calm and comfortable.  Not in distress at this time. Skin: No rashes, lesions or ulcers. HEENT: Atraumatic, normocephalic, supple neck, no obvious bleeding Lungs: Minimal bilateral scattered rales CVS: Regular rate and rhythm, no murmur, mild cough on deep breathing. GI/Abd soft, nontender, nondistended, bowel sound present CNS: Alert, awake, oriented x3 Psychiatry: Mood appropriate Extremities: No pedal edema, no calf tenderness  Data Review: I have personally reviewed the laboratory data and studies  available.  Recent Labs  Lab 01/20/20 1926 01/21/20 0402 01/22/20 0342  WBC 6.3 7.5 5.6  NEUTROABS 4.6 6.1 3.8  HGB 12.7 12.4 13.1  HCT 41.0 39.9 42.8  MCV 88.6 89.3 88.4  PLT 293 309 327   Recent Labs  Lab 01/20/20 1926 01/21/20 0402 01/22/20 0342  NA 138 139 138  K 3.3* 3.6 4.0  CL 100 99 98  CO2 29 29 30   GLUCOSE 256* 312* 340*  BUN 5* 6 10  CREATININE 0.44 0.46 0.45  CALCIUM 8.4* 8.5* 9.1    Signed, Terrilee Croak, MD [facility] Pager: [phone] (Secure Chat preferred). 01/22/2020

## 2020-01-22 NOTE — Plan of Care (Signed)
Pt alert and oriented x 4 able to express needs denies pain. Currently on Room air sats 92% no acute distress noted. Standby assist in room for mobility continent of bowel and bladder LBM 2/28. Appetite good consumed 95% breakfast able to feed self. No skin issues. Continues IV ABTs. Call bell within reach  will continue to monitor

## 2020-01-22 NOTE — Progress Notes (Signed)
Inpatient Diabetes Program Recommendations  AACE/ADA: New Consensus Statement on Inpatient Glycemic Control (2015)  Target Ranges:  Prepandial:   less than 140 mg/dL      Peak postprandial:   less than 180 mg/dL (1-2 hours)      Critically ill patients:  140 - 180 mg/dL   Lab Results  Component Value Date   GLUCAP 362 (H) 01/22/2020   HGBA1C 12.4 (H) 01/20/2020    Review of Glycemic Control  Diabetes history: DM2 Outpatient Diabetes medications: Lantus 24 units QHS, metformin 500 mg QAM and 1500 mg QHS  Current orders for Inpatient glycemic control: Lantus 30 units QD, Novolog 0-20 units tidwc + 5 units tidwc  On Decadron 6 mg Q24H HgbA1C - 12.4% - uncontrolled  Inpatient Diabetes Program Recommendations:     Add tradjenta 5 mg QD Add Novolog HS correction Add CHO mod med to heart healthy diet  Will follow closely.  Thank you. Ailene Ards, RD, LDN, CDE Inpatient Diabetes Coordinator [phone]

## 2020-01-23 LAB — COMPREHENSIVE METABOLIC PANEL
ALT: 26 U/L (ref 0–44)
AST: 19 U/L (ref 15–41)
Albumin: 3.4 g/dL — ABNORMAL LOW (ref 3.5–5.0)
Alkaline Phosphatase: 107 U/L (ref 38–126)
Anion gap: 12 (ref 5–15)
BUN: 13 mg/dL (ref 6–20)
CO2: 31 mmol/L (ref 22–32)
Calcium: 9.4 mg/dL (ref 8.9–10.3)
Chloride: 97 mmol/L — ABNORMAL LOW (ref 98–111)
Creatinine, Ser: 0.53 mg/dL (ref 0.44–1.00)
GFR calc Af Amer: >60 mL/min (ref >60)
GFR calc non Af Amer: >60 mL/min (ref >60)
Glucose, Bld: 220 mg/dL — ABNORMAL HIGH (ref 70–99)
Potassium: 3.9 mmol/L (ref 3.5–5.1)
Sodium: 140 mmol/L (ref 135–145)
Total Bilirubin: 0.2 mg/dL — ABNORMAL LOW (ref 0.3–1.2)
Total Protein: 7.4 g/dL (ref 6.5–8.1)

## 2020-01-23 LAB — CBC WITH DIFFERENTIAL/PLATELET
Abs Immature Granulocytes: 0.13 10*3/uL — ABNORMAL HIGH (ref 0.00–0.07)
Basophils Absolute: 0.1 10*3/uL (ref 0.0–0.1)
Basophils Relative: 1 %
Eosinophils Absolute: 1 10*3/uL — ABNORMAL HIGH (ref 0.0–0.5)
Eosinophils Relative: 9 %
HCT: 42.7 % (ref 36.0–46.0)
Hemoglobin: 13.7 g/dL (ref 12.0–15.0)
Immature Granulocytes: 1 %
Lymphocytes Relative: 15 %
Lymphs Abs: 1.5 10*3/uL (ref 0.7–4.0)
MCH: 28 pg (ref 26.0–34.0)
MCHC: 32.1 g/dL (ref 30.0–36.0)
MCV: 87.3 fL (ref 80.0–100.0)
Monocytes Absolute: 0.5 10*3/uL (ref 0.1–1.0)
Monocytes Relative: 5 %
Neutro Abs: 7.2 10*3/uL (ref 1.7–7.7)
Neutrophils Relative %: 69 %
Platelets: 353 10*3/uL (ref 150–400)
RBC: 4.89 MIL/uL (ref 3.87–5.11)
RDW: 13.3 % (ref 11.5–15.5)
WBC: 10.4 10*3/uL (ref 4.0–10.5)
nRBC: 0 % (ref 0.0–0.2)

## 2020-01-23 LAB — GLUCOSE, CAPILLARY
Glucose-Capillary: 303 mg/dL — ABNORMAL HIGH (ref 70–99)
Glucose-Capillary: 342 mg/dL — ABNORMAL HIGH (ref 70–99)
Glucose-Capillary: 233 mg/dL — ABNORMAL HIGH (ref 70–99)
Glucose-Capillary: 213 mg/dL — ABNORMAL HIGH (ref 70–99)

## 2020-01-23 LAB — D-DIMER, QUANTITATIVE: D-Dimer, Quant: 0.75 ug/mL-FEU — ABNORMAL HIGH (ref 0.00–0.50)

## 2020-01-23 LAB — C-REACTIVE PROTEIN: CRP: 1.9 mg/dL — ABNORMAL HIGH (ref <1.0)

## 2020-01-23 LAB — FERRITIN: Ferritin: 212 ng/mL (ref 11–307)

## 2020-01-23 MED ORDER — METFORMIN HCL 500 MG PO TABS
1000.0000 mg | ORAL_TABLET | Freq: Two times a day (BID) | ORAL | Status: DC
  Administered 2020-01-23 (×2): 1000 mg via ORAL
  Filled 2020-01-23 (×2): qty 2

## 2020-01-23 MED ORDER — MECLIZINE HCL 25 MG PO TABS: 25.0000 mg | ORAL_TABLET | Freq: Three times a day (TID) | ORAL | Status: DC | PRN

## 2020-01-23 NOTE — Progress Notes (Signed)
Inpatient Diabetes Program Recommendations  AACE/ADA: New Consensus Statement on Inpatient Glycemic Control (2015)  Target Ranges:  Prepandial:   less than 140 mg/dL      Peak postprandial:   less than 180 mg/dL (1-2 hours)      Critically ill patients:  140 - 180 mg/dL   Lab Results  Component Value Date   GLUCAP 342 (H) 01/23/2020   HGBA1C 12.4 (H) 01/20/2020    Review of Glycemic Control  Blood sugars 303, 342 mg/dL today.  Inpatient Diabetes Program Recommendations:     Increase Lantus to 35 units QHS Increase Novolog to 8 units tidwc if pt eats > 50% meal Increase Novolog to 0-20 units tidwc and 0-5 units QHS Add Tradjenta 5 mg QD  Continue to follow.    Thank you. Ailene Ards, RD, LDN, CDE Inpatient Diabetes Coordinator [phone]

## 2020-01-23 NOTE — Progress Notes (Signed)
PROGRESS NOTE  Sierra Ellis  DOB: [birth year]  PCP: Patient, No Pcp Per [facility]IZ:[id number]  DOA: 01/20/2020 Admitted From: Home  LOS: 3 days   Chief Complaint  Patient presents with  . Fatigue  . Dizziness   Brief narrative: Sierra Ellis is a 59 y.o. female with medical history significant for seizures, insulin-dependent type 2 diabetes, CAD, hypertension, OSA not on CPAP, and vertigo who presented to the ED on 2/27 with concerns of worsening headache and dizziness. Patient had tested positive for Covid back on 2/22 at an outpatient clinic.  Since then she has been having chills, headache, dizziness, decreased appetite, loss of smell and taste.  In the ED, temperature was 90.9, tachycardic and tachypneic requiring 2 L via nasal cannula. Lab work showed no leukocytosis.   Chest x-ray showed signs of bibasilar airspace disease that may represent infection.  Subjective: Patient was seen and examined this morning.  Pleasant middle-aged African-American female. Feels better.  Not requiring oxygen today. Blood sugar level running high on steroids.  Blood pressure improving  Assessment/Plan: COVID pneumonia Acute respiratory failure with hypoxia 2 L -Presented with Covid positivity, shortness of breath -COVID test: Positive Covid antigen on 2/22 at an outpatient clinic -Chest imaging -bibasilar airspace disease -Treatment: Decadron 6 mg daily for 10 days, IV Remdesivir for 5 days to complete on 3/4, -Supportive care: Vitamin C, Zinc, inhalers, Tylenol, Antitussives - benzonatate, Mucinex.   -IV Protonix while on IV Decadron. -Progression: Feels better than at presentation.  Respiratory status improving.  On room air.  Inflammatory markers all improving as well. -Continue airborne/contact isolation precautions. -WBC and inflammatory markers trend as below.   Lab Results  Component Value Date   SARSCOV2NAA POSITIVE (A) 01/20/2020    Recent Labs  Lab 01/20/20 1926 01/21/20 0402  01/22/20 0342 01/23/20 0301  WBC 6.3 7.5 5.6 10.4   Recent Labs    01/20/20 1926 01/20/20 1926 01/21/20 0402 01/22/20 0342 01/23/20 0301  DDIMER 0.96*   < > 1.32* 0.73* 0.75*  FERRITIN 171  --   --  208 212  LDH 255*  --   --   --   --   CRP 8.6*   < > 8.3* 5.5* 1.9*   < > = values in this interval not displayed.   Cardiovascular issues: HTN, HLD, CAD -Home meds include Cardizem, Lasix, statin. -Continue all.  Blood pressure better in last 24 hours. -Monitor creatinine, blood pressure trend. -Hydralazine IV as needed  Insulin-dependent type 2 diabetes -Home meds include Lantus 25 units at bedtime, Metformin. -Currently on 30 units of Lantus as well as scheduled Premeal NovoLog and sliding scale insulin with Accu-Cheks.   -Blood sugar not well controlled because of steroids.  -Resume Metformin today. -Continue to monitor blood sugar.   Dizziness Chronic vertigo -Patient has chronic vertigo and takes meclizine at home. -Patient comments today that she is having episodes of dizziness while in the hospital.  Resume meclizine 25 mg 3 times daily as needed.  Seizures Not on any antiepileptics.  Has been stable for many years.  Morbid obesity - Body mass index is 41.97 kg/m. Patient has been advised to make an attempt to improve diet and exercise patterns to aid in weight loss.  OSA  not on CPAP  DVT prophylaxis:  Lovenox Antimicrobials:  IV remdesivir Fluid: None Diet: Cardiac/diabetic diet  Code Status:  Full code Mobility: Encourage ambulation Family Communication:  Discharge plan:  Anticipated date: IV remdesivir to complete on 3/4 Disposition: Home. Barriers:  IV remdesivir.  Patient states that she does not have a ride to come back daily to infusion clinic.  She wants to complete a course of remdesivir while in the hospital.  Consultants:  None  Antimicrobials: Anti-infectives (From admission, onward)   Start     Dose/Rate Route Frequency Ordered Stop     01/22/20 1000  remdesivir 100 mg in sodium chloride 0.9 % 100 mL IVPB     100 mg 200 mL/hr over 30 Minutes Intravenous Daily 01/21/20 0000 01/26/20 0959   01/21/20 0030  remdesivir 100 mg in sodium chloride 0.9 % 100 mL IVPB     100 mg 200 mL/hr over 30 Minutes Intravenous Every 30 min 01/21/20 0000 01/21/20 5852        Code Status: Full Code   Diet Order            Diet heart healthy/carb modified Room service appropriate? Yes; Fluid consistency: Thin  Diet effective now              Infusions:  . remdesivir 100 mg in NS 100 mL 100 mg (01/23/20 1017)    Scheduled Meds: . vitamin C  500 mg Oral Daily  . atorvastatin  20 mg Oral Daily  . dexamethasone (DECADRON) injection  6 mg Intravenous QHS  . diltiazem  120 mg Oral Daily  . enoxaparin (LOVENOX) injection  60 mg Subcutaneous QHS  . furosemide  20 mg Oral BID  . insulin aspart  0-20 Units Subcutaneous TID WC  . insulin aspart  0-5 Units Subcutaneous QHS  . insulin aspart  5 Units Subcutaneous TID WC  . insulin glargine  30 Units Subcutaneous QHS  . metFORMIN  1,000 mg Oral BID WC  . multivitamin with minerals  1 tablet Oral Daily  . pantoprazole (PROTONIX) IV  40 mg Intravenous Q24H  . potassium chloride SA  20 mEq Oral Daily  . zinc sulfate  220 mg Oral Daily    PRN meds: chlorpheniramine-HYDROcodone, guaiFENesin-dextromethorphan, hydrALAZINE, meclizine   Objective: Vitals:   01/23/20 1002 01/23/20 1331  BP: (!) 177/95 (!) 162/95  Pulse: 89 81  Resp:  18  Temp:  97.7 F (36.5 C)  SpO2:  98%   No intake or output data in the 24 hours ending 01/23/20 1626 Filed Weights   01/20/20 1922  Weight: 121.6 kg   Weight change:  Body mass index is 41.97 kg/m.   Physical Exam: General exam: Appears calm and comfortable.  Not in distress at this time. Skin: No rashes, lesions or ulcers. HEENT: Atraumatic, normocephalic, supple neck, no obvious bleeding Lungs: Clear to [place] bilaterally, no crackles or  wheezing. CVS: Regular rate and rhythm, no murmur, mild cough on deep breathing. GI/Abd soft, nontender, nondistended, bowel sound present CNS: Alert, awake, oriented x3 Psychiatry: Mood appropriate Extremities: No pedal edema, no calf tenderness  Data Review: I have personally reviewed the laboratory data and studies available.  Recent Labs  Lab 01/20/20 1926 01/21/20 0402 01/22/20 0342 01/23/20 0301  WBC 6.3 7.5 5.6 10.4  NEUTROABS 4.6 6.1 3.8 7.2  HGB 12.7 12.4 13.1 13.7  HCT 41.0 39.9 42.8 42.7  MCV 88.6 89.3 88.4 87.3  PLT 293 309 327 353   Recent Labs  Lab 01/20/20 1926 01/21/20 0402 01/22/20 0342 01/23/20 0301  NA 138 139 138 140  K 3.3* 3.6 4.0 3.9  CL 100 99 98 97*  CO2 29 29 30 31   GLUCOSE 256* 312* 340* 220*  BUN 5* 6 10  13  CREATININE 0.44 0.46 0.45 0.53  CALCIUM 8.4* 8.5* 9.1 9.4    Signed, Lorin Glass, MD [facility] Pager: [phone] (Secure Chat preferred). 01/23/2020

## 2020-01-23 NOTE — Care Management Important Message (Signed)
Important Message  Patient Details IM Letter given to [name] SW Case Manager to present to the Patient Name: Sierra Ellis MRN: [id number] Date of Birth: [birth year]   Medicare Important Message Given:  Yes     Caren Macadam 01/23/2020, 9:56 AM

## 2020-01-23 NOTE — Care Management Important Message (Deleted)
Important Message  Patient Details IM Letter given to Ezekiel Ina RN Case Manager to present to the Patient Name: Sierra Ellis MRN: [id number] Date of Birth: [birth year]   Medicare Important Message Given:  Yes     Caren Macadam 01/23/2020, 9:55 AM

## 2020-01-24 DIAGNOSIS — J9601 Acute respiratory failure with hypoxia: Secondary | ICD-10-CM | POA: Diagnosis present

## 2020-01-24 LAB — CBC WITH DIFFERENTIAL/PLATELET
Abs Immature Granulocytes: 0.26 10*3/uL — ABNORMAL HIGH (ref 0.00–0.07)
Basophils Absolute: 0.1 10*3/uL (ref 0.0–0.1)
Basophils Relative: 1 %
Eosinophils Absolute: 0 10*3/uL (ref 0.0–0.5)
Eosinophils Relative: 0 %
HCT: 44.6 % (ref 36.0–46.0)
Hemoglobin: 13.8 g/dL (ref 12.0–15.0)
Immature Granulocytes: 2 %
Lymphocytes Relative: 15 %
Lymphs Abs: 1.6 10*3/uL (ref 0.7–4.0)
MCH: 27.4 pg (ref 26.0–34.0)
MCHC: 30.9 g/dL (ref 30.0–36.0)
MCV: 88.5 fL (ref 80.0–100.0)
Monocytes Absolute: 0.6 10*3/uL (ref 0.1–1.0)
Monocytes Relative: 5 %
Neutro Abs: 8.4 10*3/uL — ABNORMAL HIGH (ref 1.7–7.7)
Neutrophils Relative %: 77 %
Platelets: 325 10*3/uL (ref 150–400)
RBC: 5.04 MIL/uL (ref 3.87–5.11)
RDW: 13.5 % (ref 11.5–15.5)
WBC: 10.9 10*3/uL — ABNORMAL HIGH (ref 4.0–10.5)
nRBC: 0 % (ref 0.0–0.2)

## 2020-01-24 LAB — COMPREHENSIVE METABOLIC PANEL
ALT: 24 U/L (ref 0–44)
AST: 22 U/L (ref 15–41)
Albumin: 3.3 g/dL — ABNORMAL LOW (ref 3.5–5.0)
Alkaline Phosphatase: 101 U/L (ref 38–126)
Anion gap: 13 (ref 5–15)
BUN: 17 mg/dL (ref 6–20)
CO2: 25 mmol/L (ref 22–32)
Calcium: 8.9 mg/dL (ref 8.9–10.3)
Chloride: 99 mmol/L (ref 98–111)
Creatinine, Ser: 0.65 mg/dL (ref 0.44–1.00)
GFR calc Af Amer: >60 mL/min (ref >60)
GFR calc non Af Amer: >60 mL/min (ref >60)
Glucose, Bld: 204 mg/dL — ABNORMAL HIGH (ref 70–99)
Potassium: 4.2 mmol/L (ref 3.5–5.1)
Sodium: 137 mmol/L (ref 135–145)
Total Bilirubin: 0.8 mg/dL (ref 0.3–1.2)
Total Protein: 7.3 g/dL (ref 6.5–8.1)

## 2020-01-24 LAB — GLUCOSE, CAPILLARY
Glucose-Capillary: 199 mg/dL — ABNORMAL HIGH (ref 70–99)
Glucose-Capillary: 260 mg/dL — ABNORMAL HIGH (ref 70–99)
Glucose-Capillary: 193 mg/dL — ABNORMAL HIGH (ref 70–99)
Glucose-Capillary: 141 mg/dL — ABNORMAL HIGH (ref 70–99)

## 2020-01-24 LAB — D-DIMER, QUANTITATIVE: D-Dimer, Quant: 1.04 ug/mL-FEU — ABNORMAL HIGH (ref 0.00–0.50)

## 2020-01-24 LAB — C-REACTIVE PROTEIN: CRP: 0.7 mg/dL (ref <1.0)

## 2020-01-24 LAB — FERRITIN: Ferritin: 204 ng/mL (ref 11–307)

## 2020-01-24 MED ORDER — INSULIN ASPART 100 UNIT/ML ~~LOC~~ SOLN: 6.0000 [IU] | Freq: Three times a day (TID) | SUBCUTANEOUS | Status: DC

## 2020-01-24 MED ORDER — INSULIN ASPART 100 UNIT/ML ~~LOC~~ SOLN
8.0000 [IU] | Freq: Three times a day (TID) | SUBCUTANEOUS | Status: DC
  Administered 2020-01-24 – 2020-01-25 (×3): 8 [IU] via SUBCUTANEOUS

## 2020-01-24 MED ORDER — GLUCERNA SHAKE PO LIQD
237.0000 mL | Freq: Three times a day (TID) | ORAL | Status: DC
  Administered 2020-01-24: 237 mL via ORAL
  Filled 2020-01-24 (×3): qty 237

## 2020-01-24 NOTE — Progress Notes (Signed)
Patient oxygen saturation was 97% on room air at rest and was between 92-95% while ambulating in room. Patient denies any shortness of breath.

## 2020-01-24 NOTE — Progress Notes (Signed)
Initial Nutrition Assessment  DOCUMENTATION CODES:   Morbid obesity  INTERVENTION:  Glucerna Shake po TID, each supplement provides 220 kcal and 10 grams of protein (chocolate)  Education (handout discussed attached to d/c instructions for patient)  Consider outpatient referral to [facility]   NUTRITION DIAGNOSIS:   Inadequate oral intake related to catabolic illness(pneumonia secondary to COVID-19 virus) as evidenced by estimated needs(patient reports decreased po intake d/t loss of tast and smell, eating 75% of meals on HH/CM diet insufficient to meet needs).    GOAL:   Patient will meet greater than or equal to 90% of their needs    MONITOR:   PO intake, Weight trends, Supplement acceptance, Labs, I & O's  REASON FOR ASSESSMENT:   Consult Assessment of nutrition requirement/status, Diet education  ASSESSMENT:  59 year old female with past medical history significant of seizure, IDDM2, CAD, HTN, OSA not on CPAP, vertigo, and COVID-19 positive on 2/22 presented with worsening headache, dizziness, decreased appetite, loss of taste and smell, and mild SOB  Patient admitted on 2/27 for pneumonia due to COVID-19 virus.  Spoke with patient via phone this afternoon. She reports improving taste and smell and recalls eating most of her salmon, green beans, macaroni and chz for dinner. Meal history reviewed, patient eating 75% x 2 documented meals on HH/CM diet. Will provide Glucerna Shake to aid with estimated needs. Patient prefers chocolate flavor.   Patient reports usually having a good appetite at home and recalls 3-4 meals/day. For breakfast she recalls eating a gravy biscuit from [employer], assorted [employer] soups for lunch, and fast foods, chicken with vegetables or spinach pasta for dinner. Patient stated that she uses Mrs. Dash and garlic to season foods, and has been trying to eat more healthy.   RD educated on high  sodium content of canned soups, encouraged looking for low sodium, heart healthy when shopping. Encouraged limiting fast food intake and educated on how to make healthier choices when dining out. RD informed patient that "Heart Healthy Consistent Carbohydrate" education handout has been attached to discharge summary and recommended visiting ADA website for meal planning and recipes.    Obesity is a complex, chronic medical condition that is optimally managed by a multidisciplinary care team. Weight loss is not an ideal goal for an acute inpatient hospitalization. Patient motivated to make lifestyle choices, stated family is supportive and willing to join her in her efforts. Patient expressed her interest in outpatient services. Consider outpatient referral to [facility] Nutrition [facility] Diabetes [facility].   Current wt 267.52 lbs No recent wt history for review, last weight prior to admission 122.5 kg in 2019.  Medications reviewed and include: Vit C, decadron, lasix, SSI, lantus, MVI, protonix, klor-con, zinc sulfate, remdesivir  Labs: CBGs 193, 260, 199, 213 x 24 hrs, WBC 10.9 (H) Lab Results  Component Value Date   HGBA1C 12.4 (H) 01/20/2020     NUTRITION - FOCUSED PHYSICAL EXAM: Unable to complete at this time, RD working remotely due to COVID-19 positive patient status.    Diet Order:   Diet Order            Diet heart healthy/carb modified Room service appropriate? Yes; Fluid consistency: Thin  Diet effective now              EDUCATION NEEDS:   Education needs have been addressed  Skin:  Skin Assessment: Reviewed RN Assessment  Last BM:  3/2  Height:   Ht  Readings from Last 1 Encounters:  01/20/20 5\' 7"  (1.702 m)    Weight:   Wt Readings from Last 1 Encounters:  01/20/20 121.6 kg    Ideal Body Weight:     BMI:  Body mass index is 41.97 kg/m.  Estimated Nutritional Needs:   Kcal:  2000-2200  Protein:  100-110  Fluid:  >/= 2 L/day   Lajuan Lines, RD, LDN Clinical Nutrition After Hours/Weekend Pager # in [place]

## 2020-01-24 NOTE — Progress Notes (Signed)
[facility]  PROGRESS NOTE  Sierra Ellis DVV:[id number] DOB: 20-Aug-1961 DOA: 01/20/2020 PCP: Patient, No Pcp Per Admit date - 01/20/2020   Admitting Physician Anselm Jungling, DO  Outpatient Primary MD for the patient is Patient, No Pcp Per  LOS - 4 Brief Narrative   Sierra Ellis is a 59 y.o. year old female with medical history significant for seizures, type 2 diabetes, CAD, HTN, OSA not on CPAP, vertigo who presented on 01/20/2020 with worsening headache, dizziness, diminished appetite, loss of smell/taste in the setting of outpatient diagnosis of Covid 19 on 2/22.  In the ED Patient had T-max 99.4, respiratory rate 21-23,, required 2 L nasal cannula to maintain oxygen saturation, tachycardic to 104-108, hypertensive at 172/91.  Repeat nasopharyngeal swab for SARS coronavirus was again positive on 2/27.  Blood work notable for D-dimer of 0.96, procalcitonin 0.1, LDH 255, ferritin 171, CRP 8.6. Chest x-ray showed basilar airspace disease concerning for infection.  Patient was admitted with a diagnosis of acute hypoxic respiratory failure secondary to COVID-19 pneumonia.  Patient was started on IV Decadron remdesivir.    Subjective  Today reports continued improvement in breathing. Occasional cough. Normal appetite  A & P   Acute hypoxic respiratory failure secondary to COVID-19 pneumonia, improving Maintain normal oxygen saturation at rest without supplemental O2.  Currently on day 4 of IV remdesivir/IV Decadron out of 5.  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 Anticipated d/c date: 24 to 48 hours. Barriers to d/c or necessity for inpatient status:  Need to complete and then dissipate before discharge, intermittent oxygen requirements with exertion Consults  :  none  Procedures  :  none  DVT Prophylaxis  :  Lovenox  Lab Results  Component Value Date   PLT 325 01/24/2020    Diet :  Diet Order            Diet heart healthy/carb modified Room service appropriate? Yes; Fluid consistency: Thin  Diet effective now               Inpatient Medications Scheduled Meds: . vitamin C  500 mg Oral Daily  . atorvastatin  20 mg Oral Daily  . dexamethasone (DECADRON) injection  6 mg Intravenous QHS  . diltiazem  120 mg Oral Daily  . enoxaparin (LOVENOX) injection  60 mg Subcutaneous QHS  . furosemide  20 mg Oral BID  . insulin aspart  0-20 Units Subcutaneous TID WC  . insulin aspart  0-5 Units Subcutaneous QHS  . insulin aspart  8 Units Subcutaneous TID WC  . insulin glargine  30 Units Subcutaneous QHS  . multivitamin with minerals  1 tablet Oral Daily  . pantoprazole (PROTONIX) IV  40 mg Intravenous Q24H  . potassium chloride SA  20 mEq  Oral Daily  . zinc sulfate  220 mg Oral Daily   Continuous Infusions: . remdesivir 100 mg in NS 100 mL 100 mg (01/24/20 1123)   PRN Meds:.chlorpheniramine-HYDROcodone, guaiFENesin-dextromethorphan, hydrALAZINE, meclizine  Antibiotics  :   Anti-infectives (From admission, onward)   Start     Dose/Rate Route Frequency Ordered Stop   01/22/20 1000  remdesivir 100 mg in sodium chloride 0.9 % 100 mL IVPB     100 mg 200 mL/hr over 30 Minutes Intravenous Daily 01/21/20 0000 01/26/20 0959    01/21/20 0030  remdesivir 100 mg in sodium chloride 0.9 % 100 mL IVPB     100 mg 200 mL/hr over 30 Minutes Intravenous Every 30 min 01/21/20 0000 01/21/20 0509       Objective   Vitals:   01/23/20 2034 01/23/20 2041 01/24/20 0552 01/24/20 1102  BP: (!) 165/94 (!) 161/82 (!) 164/74 (!) 154/86  Pulse: 80  84 93  Resp: 20  18   Temp: 98.2 F (36.8 C)  98.2 F (36.8 C)   TempSrc: Oral  Oral   SpO2:      Weight:      Height:        SpO2: 98 % O2 Flow Rate (L/min): 2 L/min  Wt Readings from Last 3 Encounters:  01/20/20 121.6 kg  10/29/18 122.5 kg  08/31/18 120.2 kg    No intake or output data in the 24 hours ending 01/24/20 1147  Physical Exam:  Awake Alert, Oriented X 3, Normal affect Sitting in chair No new F.N deficits,  [place].AT, No JVD Symmetrical Chest wall movement, Good air movement bilaterally on room air RRR,No Gallops,Rubs or new Murmurs,  +ve B.Sounds, Abd Soft, No tenderness, No rebound, guarding or rigidity.    I have personally reviewed the following:   Data Reviewed:  CBC Recent Labs  Lab 01/20/20 1926 01/21/20 0402 01/22/20 0342 01/23/20 0301 01/24/20 0319  WBC 6.3 7.5 5.6 10.4 10.9*  HGB 12.7 12.4 13.1 13.7 13.8  HCT 41.0 39.9 42.8 42.7 44.6  PLT 293 309 327 353 325  MCV 88.6 89.3 88.4 87.3 88.5  MCH 27.4 27.7 27.1 28.0 27.4  MCHC 31.0 31.1 30.6 32.1 30.9  RDW 13.2 13.2 13.2 13.3 13.5  LYMPHSABS 1.3 1.1 1.4 1.5 1.6  MONOABS 0.4 0.2 0.2 0.5 0.6  EOSABS 0.0 0.0 0.1 1.0* 0.0  BASOSABS 0.0 0.0 0.0 0.1 0.1    Chemistries  Recent Labs  Lab 01/20/20 1926 01/21/20 0402 01/22/20 0342 01/23/20 0301 01/24/20 0319  NA 138 139 138 140 137  K 3.3* 3.6 4.0 3.9 4.2  CL 100 99 98 97* 99  CO2 29 29 30 31 25   GLUCOSE 256* 312* 340* 220* 204*  BUN 5* 6 10 13 17   CREATININE 0.44 0.46 0.45 0.53 0.65  CALCIUM 8.4* 8.5* 9.1 9.4 8.9  AST 31 29 22 19 22   ALT 32 30 30 26 24   ALKPHOS 118 119 122 107 101  BILITOT 0.9 0.7 0.6 0.2* 0.8    ------------------------------------------------------------------------------------------------------------------ No results for input(s): CHOL, HDL, LDLCALC, TRIG, CHOLHDL, LDLDIRECT in the last 72 hours.  Lab Results  Component Value Date   HGBA1C 12.4 (H) 01/20/2020   ------------------------------------------------------------------------------------------------------------------ No results for input(s): TSH, T4TOTAL, T3FREE, THYROIDAB in the last 72 hours.  Invalid input(s): FREET3 ------------------------------------------------------------------------------------------------------------------ Recent Labs    01/23/20 0301 01/24/20 0319  FERRITIN 212 204    Coagulation profile No results for input(s): INR, PROTIME in the last 168 hours.  Recent Labs  01/23/20 0301 01/24/20 0319  DDIMER 0.75* 1.04*    Cardiac Enzymes No results for input(s): CKMB, TROPONINI, MYOGLOBIN in the last 168 hours.  Invalid input(s): CK ------------------------------------------------------------------------------------------------------------------ No results found for: BNP  Micro Results Recent Results (from the past 240 hour(s))  Blood Culture (routine x 2)     Status: None (Preliminary result)   Collection Time: 01/20/20  7:26 PM   Specimen: BLOOD  Result Value Ref Range Status   Specimen Description   Final    BLOOD LEFT ANTECUBITAL Performed at [facility], [address], [place], [place] [address]    Special Requests   Final    BOTTLES DRAWN AEROBIC AND ANAEROBIC Blood Culture adequate volume Performed at [facility], [address] [place], [place] [address]    Culture   Final    NO GROWTH 3 DAYS Performed at [facility], [address], [place], [place] [address]    Report Status PENDING  Incomplete  Blood Culture (routine x 2)     Status: None (Preliminary result)   Collection Time: 01/20/20  7:31 PM   Specimen:  BLOOD RIGHT HAND  Result Value Ref Range Status   Specimen Description   Final    BLOOD RIGHT HAND Performed at [facility], [address] [place], [place] [address]    Special Requests   Final    BOTTLES DRAWN AEROBIC AND ANAEROBIC Blood Culture adequate volume Performed at [facility], [address] [place], [place] [address]    Culture   Final    NO GROWTH 3 DAYS Performed at [facility], [address] [place], [place] [address]    Report Status PENDING  Incomplete  SARS CORONAVIRUS 2 (TAT 6-24 HRS) Nasopharyngeal Nasopharyngeal Swab     Status: Abnormal   Collection Time: 01/20/20  9:29 PM   Specimen: Nasopharyngeal Swab  Result Value Ref Range Status   SARS Coronavirus 2 POSITIVE (A) NEGATIVE Final    Comment: RESULT CALLED TO, READ BACK BY AND VERIFIED WITH: D. ONBEWA,RN [id number] 01/21/2020 T. TYSOR (NOTE) SARS-CoV-2 target nucleic acids are DETECTED. The SARS-CoV-2 RNA is generally detectable in upper and lower respiratory specimens during the acute phase of infection. Positive results are indicative of the presence of SARS-CoV-2 RNA. Clinical correlation with patient history and other diagnostic information is  necessary to determine patient infection status. Positive results do not rule out bacterial infection or co-infection with other viruses.  The expected result is Negative. Fact Sheet for Patients: HairSlick.no Fact Sheet for Healthcare Providers: quierodirigir.com This test is not yet approved or cleared by the [place] FDA and  has been authorized for detection and/or diagnosis of SARS-CoV-2 by FDA under an Emergency Use Authorization (EUA). This EUA will remain  in effect (meaning this test can be used) for t he duration of the COVID-19 declaration under Section 564(b)(1) of the Act, 21 U.S.C. section 360bbb-3(b)(1), unless the authorization is terminated  or revoked sooner. Performed at [facility], [address] [place], [place] [address]     Radiology Reports DG Chest [place] 1 View  Result Date: 01/20/2020 CLINICAL DATA:  Left lower lung pain for 1 week, fever EXAM: PORTABLE CHEST 1 VIEW COMPARISON:  07/20/2019 FINDINGS: Cardiomediastinal contours are stable accounting for differences in projection. There is signs of basilar airspace disease at the lung bases bilaterally. No signs of acute bone finding. IMPRESSION: Signs of basilar airspace disease may represent  infection. Study limited by lordotic portable technique. Electronically Signed   By: Zetta Bills M.D.   On: 01/20/2020 20:44     Time Spent in minutes  30     Desiree Hane M.D on 01/24/2020 at 11:47 AM  To page go to [email or web address] - password [facility]

## 2020-01-25 ENCOUNTER — Encounter (HOSPITAL_COMMUNITY): Payer: Self-pay | Admitting: Family Medicine

## 2020-01-25 LAB — GLUCOSE, CAPILLARY: Glucose-Capillary: 284 mg/dL — ABNORMAL HIGH (ref 70–99)

## 2020-01-25 LAB — CULTURE, BLOOD (ROUTINE X 2)
Culture: NO GROWTH
Culture: NO GROWTH
Special Requests: ADEQUATE
Special Requests: ADEQUATE

## 2020-01-25 MED ORDER — ZINC SULFATE 220 (50 ZN) MG PO CAPS: 220.0000 mg | ORAL_CAPSULE | Freq: Every day | ORAL | 0 refills | Status: AC

## 2020-01-25 MED ORDER — DILTIAZEM HCL ER COATED BEADS 180 MG PO CP24: 180.0000 mg | ORAL_CAPSULE | Freq: Every day | ORAL | 1 refills | Status: DC

## 2020-01-25 MED ORDER — DEXAMETHASONE 4 MG PO TABS: 6.0000 mg | ORAL_TABLET | Freq: Two times a day (BID) | ORAL | Status: DC

## 2020-01-25 MED ORDER — DEXAMETHASONE 4 MG PO TABS
6.0000 mg | ORAL_TABLET | Freq: Every day | ORAL | Status: DC
  Administered 2020-01-25: 6 mg via ORAL
  Filled 2020-01-25 (×3): qty 2

## 2020-01-25 MED ORDER — AMLODIPINE BESYLATE 5 MG PO TABS: 5.0000 mg | ORAL_TABLET | Freq: Every day | ORAL | Status: DC

## 2020-01-25 MED ORDER — GUAIFENESIN-DM 100-10 MG/5ML PO SYRP: 10.0000 mL | ORAL_SOLUTION | ORAL | 0 refills | Status: AC | PRN

## 2020-01-25 MED ORDER — DEXAMETHASONE 6 MG PO TABS: 6.0000 mg | ORAL_TABLET | Freq: Every day | ORAL | 0 refills | Status: AC

## 2020-01-25 MED ORDER — DILTIAZEM HCL ER COATED BEADS 180 MG PO CP24
180.0000 mg | ORAL_CAPSULE | Freq: Every day | ORAL | Status: DC
  Administered 2020-01-25: 180 mg via ORAL
  Filled 2020-01-25: qty 1

## 2020-01-25 MED ORDER — AMLODIPINE BESYLATE 5 MG PO TABS: 5.0000 mg | ORAL_TABLET | Freq: Every day | ORAL | 1 refills | Status: DC

## 2020-01-25 MED ORDER — LANTUS SOLOSTAR 100 UNIT/ML ~~LOC~~ SOPN: PEN_INJECTOR | SUBCUTANEOUS | 11 refills | Status: DC

## 2020-01-25 MED ORDER — ALBUTEROL SULFATE HFA 108 (90 BASE) MCG/ACT IN AERS: 2.0000 | INHALATION_SPRAY | Freq: Four times a day (QID) | RESPIRATORY_TRACT | 0 refills | Status: AC | PRN

## 2020-01-25 MED ORDER — ASCORBIC ACID 500 MG PO TABS: 500.0000 mg | ORAL_TABLET | Freq: Every day | ORAL | 0 refills | Status: AC

## 2020-01-25 NOTE — Plan of Care (Signed)
Discharge instructions reviewed with patient, questions answered, verbalized understanding.  Patient awaiting transportation home.   

## 2020-01-25 NOTE — Discharge Summary (Signed)
Sierra Ellis BCW:[id number] DOB: May 05, 1961 DOA: 01/20/2020  PCP: Patient, No Pcp Per  Admit date: 01/20/2020 Discharge date: 01/25/2020  Admitted From: home Disposition:  home  Recommendations for Outpatient Follow-up:  1. Follow up with PCP in 1-2 weeks 2. New medications: decadron x 4 days, increased diltiazem to 180 mg, lantus 30 U qd while on decadron for four days then back to home regimen of lantus 25 U, albuterol inhaler PRN  Home Health:none  Equipment/Devices:none  Discharge Condition:Stable  CODE STATUS:FULL    Brief/Interim Summary: History of present illness:  Sierra Ellis is a 59 y.o. year old female with medical history significant for seizures, type 2 diabetes, CAD, HTN, OSA not on CPAP, vertigo who presented on 01/20/2020 with worsening headache, dizziness, diminished appetite, loss of smell/taste in the setting of outpatient diagnosis of Covid 19 on 2/22.  In the ED Patient had T-max 99.4, respiratory rate 21-23, required 2 L nasal cannula to maintain oxygen saturation, tachycardic to 104-108, hypertensive at 172/91.  Repeat nasopharyngeal swab for SARS coronavirus was again positive on 2/27.  Blood work notable for D-dimer of 0.96, procalcitonin 0.1, LDH 255, ferritin 171, CRP 8.6. Chest x-ray showed basilar airspace disease concerning for infection.  Patient was admitted with a diagnosis of acute hypoxic respiratory failure secondary to COVID-19 pneumonia.  Patient was started on IV Decadron remdesivir.   Remaining hospital course addressed in problem based format below:   Hospital Course:   Acute hypoxic respiratory failure secondary to COVID-19 pneumonia Initially requiring 2 L nasal cannula to maintain normal oxygen saturation in the setting of chest x-ray patient was started on remdesivir and IV Decadron.  Patient was able to wean to room air and maintain normal oxygen saturation with ambulation.  Patient completed 5-day course of IV remdesivir and Decadron.  On  discharge patient will continue additional 5 days of Decadron, vitamin C and zinc.  Her markers have greatly improved from initial CRP of 8.6-0.7 on discharge, and peak a D-dimer of 1.32, 1.04 on discharge. -Continue for additional days of Decadron on discharge -Supportive care with vitamin C, zinc, as needed albuterol, cough syrup  Type 2 diabetes, poorly controlled, A1c 12.4 on admission Further worsened by use of IV Decadron which required increase in her Lantus regimen for 25 units to 30 units. -Patient instructed to continue higher dose Lantus while on p.o. Decadron on discharge -Dr. To resume previous home regimen of 25 units once Decadron completed -Close PCP follow-up -Can resume Metformin on discharge  Hypertension SBP range 160s-170s.  Likely worse in the setting of steroids from above -Home diltiazem increased from 120 to 180 mg on discharge -Advise close PCP follow-up   Consultations:  None  Procedures/Studies: None Subjective: Occasional cough, no shortness of breath, normal appetite Discharge Exam: Vitals:   01/24/20 2151 01/25/20 0634  BP:  (!) 173/92  Pulse:  86  Resp:  20  Temp:  98.2 F (36.8 C)  SpO2: 94%    Vitals:   01/24/20 2120 01/24/20 2145 01/24/20 2151 01/25/20 0634  BP: (!) 160/78   (!) 173/92  Pulse: 77   86  Resp: 18   20  Temp: 97.8 F (36.6 C)   98.2 F (36.8 C)  TempSrc: Oral   Oral  SpO2:  97% 94%   Weight:      Height:        General: Sitting in bedside chair, no apparent distress Eyes: EOMI, anicteric ENT: Oral Mucosa clear and moist Cardiovascular: regular rate and  rhythm, no murmurs, rubs or gallops, no edema, Respiratory: Normal respiratory effort on room air, lungs clear to auscultation bilaterally Abdomen: soft, non-distended, non-tender, normal bowel sounds Skin: No Rash Neurologic: Grossly no focal neuro deficit.Mental status AAOx3, speech normal, Psychiatric:Appropriate affect, and mood  Discharge Diagnoses:   Principal Problem:   Pneumonia due to COVID-19 virus Active Problems:   Type 2 diabetes mellitus with hyperlipidemia (HCC)   Hypertension   Hyperlipidemia   Nonepileptic episode (HCC)   Hypokalemia   CAD (coronary artery disease)   OSA (obstructive sleep apnea)   Severe obesity (BMI >= 40) (HCC)   Acute respiratory failure with hypoxia [facility](HCC)    Discharge Instructions  Discharge Instructions    Diet - low sodium heart healthy   Complete by: As directed    Increase activity slowly   Complete by: As directed    MyChart COVID-19 home monitoring program   Complete by: Jan 25, 2020    Is the patient willing to use the MyChart Mobile App for home monitoring?: Yes   Temperature monitoring   Complete by: Jan 25, 2020    After how many days would you like to receive a notification of this patient's flowsheet entries?: 1     Allergies as of 01/25/2020      Reactions   Aspirin Other (See Comments)   Seizures.    Demerol Other (See Comments)   Seizures.    Lisinopril Cough   Losartan    Sneezing and coughing   Morphine Other (See Comments)   Seizures   Oxycodone Other (See Comments)   seizures   Penicillins Other (See Comments)   Seizures.Has patient had a PCN reaction causing immediate rash, facial/tongue/throat swelling, SOB or lightheadedness with hypotension: Yes Has patient had a PCN reaction causing severe rash involving mucus membranes or skin necrosis: Yes Has patient had a PCN reaction that required hospitalizationYes Has patient had a PCN reaction occurring within the last 10 years: Yes If all of the above answers are "NO", then may proceed with Cephalosporin use.   Phentermine    History of seizure disorder   Trazodone Other (See Comments)   Hallucinations      Medication List    STOP taking these medications   celecoxib 200 MG capsule Commonly known as: CeleBREX   fluconazole 150 MG tablet Commonly known as: Diflucan   losartan 50 MG tablet Commonly  known as: COZAAR   traZODone 50 MG tablet Commonly known as: DESYREL     TAKE these medications   albuterol 108 (90 Base) MCG/ACT inhaler Commonly known as: VENTOLIN HFA Inhale 2 puffs into the lungs every 6 (six) hours as needed for wheezing or shortness of breath.   ascorbic acid 500 MG tablet Commonly known as: VITAMIN C Take 1 tablet (500 mg total) by mouth daily for 5 days. Notes to patient: 01/26/2020    atorvastatin 20 MG tablet Commonly known as: LIPITOR Take 20 mg by mouth daily. Notes to patient: 01/26/2020    cholecalciferol 25 MCG (1000 UNIT) tablet Commonly known as: VITAMIN D3 Take 1,000 Units by mouth daily. Notes to patient: 01/25/2020    DELSYM PO Take 1 tablet by mouth daily as needed (cough).   dexamethasone 6 MG tablet Commonly known as: DECADRON Take 1 tablet (6 mg total) by mouth daily for 4 days. Start taking on: January 26, 2020 Notes to patient: 01/26/2020    diltiazem 180 MG 24 hr capsule Commonly known as: CARDIZEM CD Take 1 capsule (180  mg total) by mouth daily. What changed:   medication strength  how much to take Notes to patient: 01/26/2020    furosemide 20 MG tablet Commonly known as: LASIX Take 20 mg by mouth 2 (two) times daily as needed.   guaiFENesin-dextromethorphan 100-10 MG/5ML syrup Commonly known as: ROBITUSSIN DM Take 10 mLs by mouth every 4 (four) hours as needed for cough.   Klor-Con M20 20 MEQ tablet Generic drug: potassium chloride SA Take 20 mEq by mouth daily. Notes to patient: 01/26/2020    Lantus SoloStar 100 UNIT/ML Solostar Pen Generic drug: insulin glargine Take 30 U in the evening while taking decadron Resume taking 24 U on 3/9 What changed:   how much to take  how to take this  when to take this  additional instructions Notes to patient: 01/25/2020 bedtime    meclizine 25 MG tablet Commonly known as: ANTIVERT Take 1 tablet (25 mg total) by mouth 3 (three) times daily as needed for dizziness.   metFORMIN  500 MG tablet Commonly known as: GLUCOPHAGE Take 500-1,500 mg by mouth See admin instructions. 500mg  in AM and 1500mg  qhs What changed: Another medication with the same name was removed. Continue taking this medication, and follow the directions you see here. Notes to patient: 01/26/2020    nystatin cream Commonly known as: MYCOSTATIN Apply to affected area 2 times daily, do not insert into vagina   zinc sulfate 220 (50 Zn) MG capsule Take 1 capsule (220 mg total) by mouth daily. Notes to patient: 01/26/2020        Allergies  Allergen Reactions  . Aspirin Other (See Comments)    Seizures.   . Demerol Other (See Comments)    Seizures.   . Lisinopril Cough  . Losartan     Sneezing and coughing  . Morphine Other (See Comments)    Seizures  . Oxycodone Other (See Comments)    seizures  . Penicillins Other (See Comments)    Seizures.Has patient had a PCN reaction causing immediate rash, facial/tongue/throat swelling, SOB or lightheadedness with hypotension: Yes Has patient had a PCN reaction causing severe rash involving mucus membranes or skin necrosis: Yes Has patient had a PCN reaction that required hospitalizationYes Has patient had a PCN reaction occurring within the last 10 years: Yes If all of the above answers are "NO", then may proceed with Cephalosporin use.   03/27/2020 Phentermine     History of seizure disorder  . Trazodone Other (See Comments)    Hallucinations        The results of significant diagnostics from this hospitalization (including imaging, microbiology, ancillary and laboratory) are listed below for reference.     Microbiology: Recent Results (from the past 240 hour(s))  Blood Culture (routine x 2)     Status: None   Collection Time: 01/20/20  7:26 PM   Specimen: BLOOD  Result Value Ref Range Status   Specimen Description   Final    BLOOD LEFT ANTECUBITAL Performed at [facility], [address] [place], [place]     Special Requests   Final    BOTTLES DRAWN AEROBIC AND ANAEROBIC Blood Culture adequate volume Performed at [facility], [address] [place], [place]    Culture   Final    NO GROWTH 5 DAYS Performed at [facility], [address], [place], [address] [place]    Report Status 01/25/2020 FINAL  Final  Blood Culture (routine x 2)  Status: None   Collection Time: 01/20/20  7:31 PM   Specimen: BLOOD RIGHT HAND  Result Value Ref Range Status   Specimen Description   Final    BLOOD RIGHT HAND Performed at [facility], [address], [place], [place] [address]    Special Requests   Final    BOTTLES DRAWN AEROBIC AND ANAEROBIC Blood Culture adequate volume Performed at [facility], [address], [place], [place] [address]    Culture   Final    NO GROWTH 5 DAYS Performed at [facility], [address], [place], [place] [address]    Report Status 01/25/2020 FINAL  Final  SARS CORONAVIRUS 2 (TAT 6-24 HRS) Nasopharyngeal Nasopharyngeal Swab     Status: Abnormal   Collection Time: 01/20/20  9:29 PM   Specimen: Nasopharyngeal Swab  Result Value Ref Range Status   SARS Coronavirus 2 POSITIVE (A) NEGATIVE Final    Comment: RESULT CALLED TO, READ BACK BY AND VERIFIED WITH: D. ONBEWA,RN [id number] 01/21/2020 T. TYSOR (NOTE) SARS-CoV-2 target nucleic acids are DETECTED. The SARS-CoV-2 RNA is generally detectable in upper and lower respiratory specimens during the acute phase of infection. Positive results are indicative of the presence of SARS-CoV-2 RNA. Clinical correlation with patient history and other diagnostic information is  necessary to determine patient infection status. Positive results do not rule out bacterial infection or co-infection with other viruses.  The expected result is Negative. Fact Sheet for Patients: HairSlick.no Fact Sheet for Healthcare  Providers: quierodirigir.com This test is not yet approved or cleared by the [place] FDA and  has been authorized for detection and/or diagnosis of SARS-CoV-2 by FDA under an Emergency Use Authorization (EUA). This EUA will remain  in effect (meaning this test can be used) for t he duration of the COVID-19 declaration under Section 564(b)(1) of the Act, 21 U.S.C. section 360bbb-3(b)(1), unless the authorization is terminated or revoked sooner. Performed at [facility], [address] [place], [place] [address]      Labs: BNP (last 3 results) No results for input(s): BNP in the last 8760 hours. Basic Metabolic Panel: Recent Labs  Lab 01/20/20 1926 01/21/20 0402 01/22/20 0342 01/23/20 0301 01/24/20 0319  NA 138 139 138 140 137  K 3.3* 3.6 4.0 3.9 4.2  CL 100 99 98 97* 99  CO2 29 29 30 31 25   GLUCOSE 256* 312* 340* 220* 204*  BUN 5* 6 10 13 17   CREATININE 0.44 0.46 0.45 0.53 0.65  CALCIUM 8.4* 8.5* 9.1 9.4 8.9   Liver Function Tests: Recent Labs  Lab 01/20/20 1926 01/21/20 0402 01/22/20 0342 01/23/20 0301 01/24/20 0319  AST 31 29 22 19 22   ALT 32 30 30 26 24   ALKPHOS 118 119 122 107 101  BILITOT 0.9 0.7 0.6 0.2* 0.8  PROT 7.4 7.3 7.8 7.4 7.3  ALBUMIN 3.3* 3.5 3.3* 3.4* 3.3*   No results for input(s): LIPASE, AMYLASE in the last 168 hours. No results for input(s): AMMONIA in the last 168 hours. CBC: Recent Labs  Lab 01/20/20 1926 01/21/20 0402 01/22/20 0342 01/23/20 0301 01/24/20 0319  WBC 6.3 7.5 5.6 10.4 10.9*  NEUTROABS 4.6 6.1 3.8 7.2 8.4*  HGB 12.7 12.4 13.1 13.7 13.8  HCT 41.0 39.9 42.8 42.7 44.6  MCV 88.6 89.3 88.4 87.3 88.5  PLT 293 309 327 353 325   Cardiac Enzymes: No results for input(s): CKTOTAL, CKMB, CKMBINDEX, TROPONINI in the last 168 hours. BNP: Invalid  input(s): POCBNP CBG: Recent Labs  Lab 01/24/20 0811 01/24/20 1230 01/24/20 1735 01/24/20 2122 01/25/20 0744  GLUCAP 199* 260* 193* 141*  284*   D-Dimer Recent Labs    01/23/20 0301 01/24/20 0319  DDIMER 0.75* 1.04*   Hgb A1c No results for input(s): HGBA1C in the last 72 hours. Lipid Profile No results for input(s): CHOL, HDL, LDLCALC, TRIG, CHOLHDL, LDLDIRECT in the last 72 hours. Thyroid function studies No results for input(s): TSH, T4TOTAL, T3FREE, THYROIDAB in the last 72 hours.  Invalid input(s): FREET3 Anemia work up Recent Labs    01/23/20 0301 01/24/20 0319  FERRITIN 212 204   Urinalysis    Component Value Date/Time   COLORURINE YELLOW 07/20/2019 1003   APPEARANCEUR CLEAR 07/20/2019 1003   LABSPEC 1.037 (H) 07/20/2019 1003   PHURINE 5.0 07/20/2019 1003   GLUCOSEU >=500 (A) 07/20/2019 1003   HGBUR NEGATIVE 07/20/2019 1003   BILIRUBINUR NEGATIVE 07/20/2019 1003   KETONESUR 20 (A) 07/20/2019 1003   PROTEINUR NEGATIVE 07/20/2019 1003   UROBILINOGEN 1.0 01/17/2012 1641   NITRITE NEGATIVE 07/20/2019 1003   LEUKOCYTESUR MODERATE (A) 07/20/2019 1003   Sepsis Labs Invalid input(s): PROCALCITONIN,  WBC,  LACTICIDVEN Microbiology Recent Results (from the past 240 hour(s))  Blood Culture (routine x 2)     Status: None   Collection Time: 01/20/20  7:26 PM   Specimen: BLOOD  Result Value Ref Range Status   Specimen Description   Final    BLOOD LEFT ANTECUBITAL Performed at [facility], [place] [address] [place], [place] [address]    Special Requests   Final    BOTTLES DRAWN AEROBIC AND ANAEROBIC Blood Culture adequate volume Performed at [place] [address] [place], [place] [address]    Culture   Final    NO GROWTH 5 DAYS Performed at [facility], [place] [address] [place], [place] [address]    Report Status 01/25/2020 FINAL  Final  Blood Culture (routine x 2)     Status: None   Collection Time: 01/20/20  7:31 PM   Specimen: BLOOD RIGHT HAND  Result Value Ref Range Status   Specimen Description   Final    BLOOD RIGHT HAND Performed at  [facility] [address] [place], [place] [address]    Special Requests   Final    BOTTLES DRAWN AEROBIC AND ANAEROBIC Blood Culture adequate volume Performed at [place] [address] [place], [place] [address]    Culture   Final    NO GROWTH 5 DAYS Performed at [facility], [place] [address], [place], [place] [address]    Report Status 01/25/2020 FINAL  Final  SARS CORONAVIRUS 2 (TAT 6-24 HRS) Nasopharyngeal Nasopharyngeal Swab     Status: Abnormal   Collection Time: 01/20/20  9:29 PM   Specimen: Nasopharyngeal Swab  Result Value Ref Range Status   SARS Coronavirus 2 POSITIVE (A) NEGATIVE Final    Comment: RESULT CALLED TO, READ BACK BY AND VERIFIED WITH: D. ONBEWA,RN [id number] 01/21/2020 T. TYSOR (NOTE) SARS-CoV-2 target nucleic acids are DETECTED. The SARS-CoV-2 RNA is generally detectable in upper and lower respiratory specimens during the acute phase of infection. Positive results are indicative of the presence of SARS-CoV-2 RNA. Clinical correlation with patient history and other diagnostic information is  necessary to determine patient infection status. Positive results do not rule out bacterial infection or co-infection with other viruses.  The expected result is Negative. Fact  Sheet for Patients: HairSlick.no Fact Sheet for Healthcare Providers: quierodirigir.com This test is not yet approved or cleared by the [place] FDA and  has been authorized for detection and/or diagnosis of SARS-CoV-2 by FDA under an Emergency Use Authorization (EUA). This EUA will remain  in effect (meaning this test can be used) for t he duration of the COVID-19 declaration under Section 564(b)(1) of the Act, 21 U.S.C. section 360bbb-3(b)(1), unless the authorization is terminated or revoked sooner. Performed at [facility], [address] [place], [place] [address]       Time coordinating discharge: Over 30 minutes  SIGNED:   Laverna Peace, MD  [facility] 01/25/2020, 11:17 AM Pager   If 7PM-7AM, please contact night-coverage [email or web address] Password [email or web address]

## 2020-01-25 NOTE — TOC Transition Note (Signed)
Transition of Care [facility]) - CM/SW Discharge Note   Patient Details  Name: Sierra Ellis MRN: [id number] Date of Birth: 1960-11-27  Transition of Care [facility]) CM/SW Contact:  Darleene Cleaver, LCSW Phone Number: 01/25/2020, 11:01 AM   Clinical Narrative:     Patient is a 59 year old female who is alert and oriented x4.  Patient lives alone, and does not need oxygen.  CSW received consult that patient will need transportation services home.  Patient was set up by nurse to be able to use transportation services.  Patient was offered home health services, and patient states she does not want any because she has [employer], and they do wellness checkups on her.  Patient was informed that she does not qualify for oxygen or a nebulizer.  Patient asked about a different option for diabetes management, CSW informed her that the diabetes coordinator can discuss with her again.  Patient stated her family will help her find alternative methods for insulin.  Patient did not express any other needs or concerns, patient stated she is looking forward to returning back home.  CSW signing off.     Final next level of care: Home/Self Care Barriers to Discharge: Barriers Resolved   Patient Goals and CMS Choice Patient states their goals for this hospitalization and ongoing recovery are:: To return back home CMS Medicare.gov Compare Post Acute Care list provided to:: Patient Choice offered to / list presented to : Patient  Discharge Placement                    Patient and family notified of of transfer: 01/25/20  Discharge Plan and Services                DME Arranged: N/A         HH Arranged: Refused HH          Social Determinants of Health (SDOH) Interventions     Readmission Risk Interventions No flowsheet data found.

## 2020-01-25 NOTE — Discharge Instructions (Addendum)
New Medications  For your blood pressure you will take a higher dose of diltiazem 180 mg ( 1 tab) everyday--that new script is sent to your pharmacy.    For your COVID infection. You will continue taking the steroid decadron 6 mg ( 1 tablet) for four additional days, you last dose is 3/8. You will also have zinc, calcium, and cough syrup for four days.  You can use albuterol inhaler as needed for shortness of breath.  For your diabetes, you will take lantus 30 U everyday while on the steroids until 3/8.  You will go back down to 24 U on 3/9. Continue to check your blood sugar regularly   Heart Healthy, Consistent Carbohydrate Nutrition Therapy   A heart-healthy and consistent carbohydrate diet is recommended to manage heart disease and diabetes. To follow a heart-healthy and consistent carbohydrate diet, . Eat a balanced diet with whole grains, fruits and vegetables, and lean protein sources.  . Choose heart-healthy unsaturated fats. Limit saturated fats, trans fats, and cholesterol intake. Eat more plant-based or vegetarian meals using beans and soy foods for protein.  . Eat whole, unprocessed foods to limit the amount of sodium (salt) you eat.  . Choose a consistent amount of carbohydrate at each meal and snack. Limit refined carbohydrates especially sugar, sweets and sugar-sweetened beverages.  . If you drink alcohol, do so in moderation: one serving per day (women) and two servings per day (men). o One serving is equivalent to 12 ounces beer, 5 ounces wine, or 1.5 ounces distilled spirits  Tips Tips for Choosing Heart-Healthy Fats Choose lean protein and low-fat dairy foods to reduce saturated fat intake. . Saturated fat is usually found in animal-based protein and is associated with certain health risks. Saturated fat is the biggest contributor to raise low-density lipoprotein (LDL) cholesterol levels. Research shows that limiting saturated fat lowers unhealthy cholesterol levels. Eat  no more than 7% of your total calories each day from saturated fat. Ask your RDN to help you determine how much saturated fat is right for you. . There are many foods that do not contain large amounts of saturated fats. Swapping these foods to replace foods high in saturated fats will help you limit the saturated fat you eat and improve your cholesterol levels. You can also try eating more plant-based or vegetarian meals. Instead of. Try:  Whole milk, cheese, yogurt, and ice cream 1% or skim milk, low-fat cheese, non-fat yogurt, and low-fat ice cream  Fatty, marbled beef and pork Lean beef, pork, or venison  Poultry with skin Poultry without skin  Butter, stick margarine Reduced-fat, whipped, or liquid spreads  Coconut oil, palm oil Liquid vegetable oils: corn, canola, olive, soybean and safflower oils   Avoid foods that contain trans fats. . Trans fats increase levels of LDL-cholesterol. Hydrogenated fat in processed foods is the main source of trans fats in foods.  . Trans fats can be found in stick margarine, shortening, processed sweets, baked goods, some fried foods, and packaged foods made with hydrogenated oils. Avoid foods with "partially hydrogenated oil" on the ingredient list such as: cookies, pastries, baked goods, biscuits, crackers, microwave popcorn, and frozen dinners. Choose foods with heart healthy fats. . Polyunsaturated and monounsaturated fat are unsaturated fats that may help lower your blood cholesterol level when used in place of saturated fat in your diet. . Ask your RDN about taking a dietary supplement with plant sterols and stanols to help lower your cholesterol level. Marland Kitchen Research shows that substituting saturated  fats with unsaturated fats is beneficial to cholesterol levels. Try these easy swaps: Instead of. Try:  Butter, stick margarine, or solid shortening Reduced-fat, whipped, or liquid spreads  Beef, pork, or poultry with skin Fish and seafood  Chips, crackers,  snack foods Raw or unsalted nuts and seeds or nut butters Hummus with vegetables Avocado on toast  Coconut oil, palm oil Liquid vegetable oils: corn, canola, olive, soybean and safflower oils  Limit the amount of cholesterol you eat to less than 200 milligrams per day. . Cholesterol is a substance carried through the bloodstream via lipoproteins, which are known as "transporters" of fat. Some body functions need cholesterol to work properly, but too much cholesterol in the bloodstream can damage arteries and build up blood vessel linings (which can lead to heart attack and stroke). You should eat less than 200 milligrams cholesterol per day. Marland Kitchen People respond differently to eating cholesterol. There is no test available right now that can figure out which people will respond more to dietary cholesterol and which will respond less. For individuals with high intake of dietary cholesterol, different types of increase (none, small, moderate, large) in LDL-cholesterol levels are all possible.  . Food sources of cholesterol include egg yolks and organ meats such as liver, gizzards. Limit egg yolks to two to four per week and avoid organ meats like liver and gizzards to control cholesterol intake. Tips for Choosing Heart-Healthy Carbohydrates Consume a consistent amount of carbohydrate . It is important to eat foods with carbohydrates in moderation because they impact your blood glucose level. Carbohydrates can be found in many foods such as: . Grains (breads, crackers, rice, pasta, and cereals)  . Starchy Vegetables (potatoes, corn, and peas)  . Beans and legumes  . Milk, soy milk, and yogurt  . Fruit and fruit juice  . Sweets (cakes, cookies, ice cream, jam and jelly) . Your RDN will help you set a goal for how many carbohydrate servings to eat at your meals and snacks. For many adults, eating 3 to 5 servings of carbohydrate foods at each meal and 1 or 2 carbohydrate servings for each snack works well.    . Check your blood glucose level regularly. It can tell you if you need to adjust when you eat carbohydrates. . Choose foods rich in viscous (soluble) fiber . Viscous, or soluble, is found in the walls of plant cells. Viscous fiber is found only in plant-based foods. Eating foods with fiber helps to lower your unhealthy cholesterol and keep your blood glucose in range  . Rich sources of viscous fiber include vegetables (asparagus, Brussels sprouts, sweet potatoes, turnips) fruit (apricots, mangoes, oranges), legumes, and whole grains (barley, oats, and oat bran).  . As you increase your fiber intake gradually, also increase the amount of water you drink. This will help prevent constipation.  . If you have difficulty achieving this goal, ask your RDN about fiber laxatives. Choose fiber supplements made with viscous fibers such as psyllium seed husks or methylcellulose to help lower unhealthy cholesterol.  . Limit refined carbohydrates  . There are three types of carbohydrates: starches, sugar, and fiber. Some carbohydrates occur naturally in food, like the starches in rice or corn or the sugars in fruits and milk. Refined carbohydrates--foods with high amounts of simple sugars--can raise triglyceride levels. High triglyceride levels are associated with coronary heart disease. . Some examples of refined carbohydrate foods are table sugar, sweets, and beverages sweetened with added sugar. Tips for Reducing Sodium (Salt) Although sodium  is important for your body to function, too much sodium can be harmful for people with high blood pressure. As sodium and fluid buildup in your tissues and bloodstream, your blood pressure increases. High blood pressure may cause damage to other organs and increase your risk for a stroke. Even if you take a pill for blood pressure or a water pill (diuretic) to remove fluid, it is still important to have less salt in your diet. Ask your doctor and RDN what amount of sodium is  right for you. Marland Kitchen Avoid processed foods. Eat more fresh foods.  . Fresh fruits and vegetables are naturally low in sodium, as well as frozen vegetables and fruits that have no added juices or sauces.  . Fresh meats are lower in sodium than processed meats, such as bacon, sausage, and hotdogs. Read the nutrition label or ask your butcher to help you find a fresh meat that is low in sodium. . Eat less salt--at the table and when cooking.  . A single teaspoon of table salt has 2,300 mg of sodium.  . Leave the salt out of recipes for pasta, casseroles, and soups.  . Ask your RDN how to cook your favorite recipes without sodium . Be a [employer], building services.  . Look for food packages that say "salt-free" or "sodium-free." These items contain less than 5 milligrams of sodium per serving.  Marland Kitchen "Very low-sodium" products contain less than 35 milligrams of sodium per serving.  Marland Kitchen "Low-sodium" products contain less than 140 milligrams of sodium per serving.  . Beware for "Unsalted" or "No Added Salt" products. These items may still be high in sodium. Check the nutrition label. . Add flavors to your food without adding sodium.  . Try lemon juice, lime juice, fruit juice or vinegar.  . Dry or fresh herbs add flavor. Try basil, bay leaf, dill, rosemary, parsley, sage, dry mustard, nutmeg, thyme, and paprika.  . Pepper, red pepper flakes, and cayenne pepper can add spice t your meals without adding sodium. Hot sauce contains sodium, but if you use just a drop or two, it will not add up to much.  Arnoldo Morale a sodium-free seasoning blend or make your own at home. Additional Lifestyle Tips Achieve and maintain a healthy weight. . Talk with your RDN or your doctor about what is a healthy weight for you. . Set goals to reach and maintain that weight.  . To lose weight, reduce your calorie intake along with increasing your physical activity. A weight loss of 10 to 15 pounds could reduce LDL-cholesterol by 5 milligrams per  deciliter. Participate in physical activity. . Talk with your health care team to find out what types of physical activity are best for you. Set a plan to get about 30 minutes of exercise on most days.  Foods Recommended Food Group Foods Recommended  Grains Whole grain breads and cereals, including whole wheat, barley, rye, buckwheat, corn, teff, quinoa, millet, amaranth, brown or wild rice, sorghum, and oats Pasta, especially whole wheat or other whole grain types  [employer] [place] [employer], quinoa or wild rice Whole grain crackers, bread, rolls, pitas Home-made bread with reduced-sodium baking soda  Protein Foods Lean cuts of beef and pork (loin, leg, round, extra lean hamburger)  Skinless Press photographer and other wild game Dried beans and peas Nuts and nut butters Meat alternatives made with soy or textured vegetable protein  Egg whites or egg substitute Cold cuts made with lean meat or soy protein  Dairy Nonfat (skim),  low-fat, or 1%-fat milk  Nonfat or low-fat yogurt or cottage cheese Fat-free and low-fat cheese  Vegetables Fresh, frozen, or canned vegetables without added fat or salt   Fruits Fresh, frozen, canned, or dried fruit   Oils Unsaturated oils (corn, olive, peanut, soy, sunflower, canola)  Soft or liquid margarines and vegetable oil spreads  Salad dressings Seeds and nuts  Avocado   Foods Not Recommended Food Group Foods Not Recommended  Grains Breads or crackers topped with salt Cereals (hot or cold) with more than 300 mg sodium per serving Biscuits, cornbread, and other "quick" breads prepared with baking soda Bread crumbs or stuffing mix from a store High-fat bakery products, such as doughnuts, biscuits, croissants, danish pastries, pies, cookies Instant cooking foods to which you add hot water and stir--potatoes, noodles, rice, etc. Packaged starchy foods--seasoned noodle or rice dishes, stuffing mix, macaroni and cheese dinner Snacks made with partially  hydrogenated oils, including chips, cheese puffs, snack mixes, regular crackers, butter-flavored popcorn  Protein Foods Higher-fat cuts of meats (ribs, t-bone steak, regular hamburger) Bacon, sausage, or hot dogs Cold cuts, such as salami or bologna, deli meats, cured meats, corned beef Organ meats (liver, brains, gizzards, sweetbreads) Poultry with skin Fried or smoked meat, poultry, and fish Whole eggs and egg yolks (more than 2-4 per week) Salted legumes, nuts, seeds, or nut/seed butters Meat alternatives with high levels of sodium (>300 mg per serving) or saturated fat (>5 g per serving)  Dairy Whole milk,?2% fat milk, buttermilk Whole milk yogurt or ice cream Cream Half-&-half Cream cheese Sour cream Cheese  Vegetables Canned or frozen vegetables with salt, fresh vegetables prepared with salt, butter, cheese, or cream sauce Fried vegetables Pickled vegetables such as olives, pickles, or sauerkraut  Fruits Fried fruits Fruits served with butter or cream  Oils Butter, stick margarine, shortening Partially hydrogenated oils or trans fats Tropical oils (coconut, palm, palm kernel oils)  Other Candy, sugar sweetened soft drinks and desserts Salt, sea salt, garlic salt, and seasoning mixes containing salt Bouillon cubes Ketchup, barbecue sauce, Worcestershire sauce, soy sauce, teriyaki sauce Miso Salsa Pickles, olives, relish   Heart Healthy Consistent Carbohydrate Vegetarian (Lacto-Ovo) Sample 1-Day Menu  Breakfast 1 cup oatmeal, cooked (2 carbohydrate servings)   cup blueberries (1 carbohydrate serving)  11 almonds, without salt  1 cup 1% milk (1 carbohydrate serving)  1 cup coffee  Morning Snack 1 cup fat-free plain yogurt (1 carbohydrate serving)  Lunch 1 whole wheat bun (1 carbohydrate servings)  1 black bean burger (1 carbohydrate servings)  1 slice cheddar cheese, low sodium  2 slices tomatoes  2 leaves lettuce  1 teaspoon mustard  1 small pear (1 carbohydrate  servings)  1 cup green tea, unsweetened  Afternoon Snack 1/3 cup trail mix with nuts, seeds, and raisins, without salt (1 carbohydrate servinga)  Evening Meal  cup meatless chicken  2/3 cup brown rice, cooked (2 carbohydrate servings)  1 cup broccoli, cooked (2/3 carbohydrate serving)   cup carrots, cooked (1/3 carbohydrate serving)  2 teaspoons olive oil  1 teaspoon balsamic vinegar  1 whole wheat dinner roll (1 carbohydrate serving)  1 teaspoon margarine, soft, tub  1 cup 1% milk (1 carbohydrate serving)  Evening Snack 1 extra small banana (1 carbohydrate serving)  1 tablespoon peanut butter   Heart Healthy Consistent Carbohydrate Vegan Sample 1-Day Menu  Breakfast 1 cup oatmeal, cooked (2 carbohydrate servings)   cup blueberries (1 carbohydrate serving)  11 almonds, without salt  1 cup soymilk fortified  with calcium, vitamin B12, and vitamin D  1 cup coffee  Morning Snack 6 ounces soy yogurt (1 carbohydrate servings)  Lunch 1 whole wheat bun(1 carbohydrate servings)  1 black bean burger (1 carbohydrate serving)  2 slices tomatoes  2 leaves lettuce  1 teaspoon mustard  1 small pear (1 carbohydrate servings)  1 cup green tea, unsweetened  Afternoon Snack 1/3 cup trail mix with nuts, seeds, and raisins, without salt (1 carbohydrate servings)  Evening Meal  cup meatless chicken  2/3 cup brown rice, cooked (2 carbohydrate servings)  1 cup broccoli, cooked (2/3 carbohydrate serving)   cup carrots, cooked (1/3 carbohydrate serving)  2 teaspoons olive oil  1 teaspoon balsamic vinegar  1 whole wheat dinner roll (1 carbohydrate serving)  1 teaspoon margarine, soft, tub  1 cup soymilk fortified with calcium, vitamin B12, and vitamin D  Evening Snack 1 extra small banana (1 carbohydrate serving)  1 tablespoon peanut butter    Heart Healthy Consistent Carbohydrate Sample 1-Day Menu  Breakfast 1 cup cooked oatmeal (2 carbohydrate servings)  3/4 cup blueberries (1  carbohydrate serving)  1 ounce almonds  1 cup skim milk (1 carbohydrate serving)  1 cup coffee  Morning Snack 1 cup sugar-free nonfat yogurt (1 carbohydrate serving)  Lunch 2 slices whole-wheat bread (2 carbohydrate servings)  2 ounces lean [place] breast  1 ounce low-fat Swiss cheese  1 teaspoon mustard  1 slice tomato  1 lettuce leaf  1 small pear (1 carbohydrate serving)  1 cup skim milk (1 carbohydrate serving)  Afternoon Snack 1 ounce trail mix with unsalted nuts, seeds, and raisins (1 carbohydrate serving)  Evening Meal 3 ounces salmon  2/3 cup cooked brown rice (2 carbohydrate servings)  1 teaspoon soft margarine  1 cup cooked broccoli with 1/2 cup cooked carrots (1 carbohydrate serving  Carrots, cooked, boiled, drained, without salt  1 cup lettuce  1 teaspoon olive oil with vinegar for dressing  1 small whole grain roll (1 carbohydrate serving)  1 teaspoon soft margarine  1 cup unsweetened tea  Evening Snack 1 extra-small banana (1 carbohydrate serving)  Copyright 2020  [employer]. All rights reserved.

## 2020-02-15 ENCOUNTER — Inpatient Hospital Stay (HOSPITAL_COMMUNITY)
Admission: EM | Admit: 2020-02-15 | Discharge: 2020-02-17 | DRG: 176 | Disposition: A | Discharge status: Home / Self Care | Payer: Medicare HMO | Attending: Internal Medicine | Admitting: Internal Medicine

## 2020-02-15 ENCOUNTER — Emergency Department (HOSPITAL_COMMUNITY): Payer: Medicare HMO

## 2020-02-15 ENCOUNTER — Encounter: Payer: Self-pay | Admitting: Internal Medicine

## 2020-02-15 ENCOUNTER — Encounter (HOSPITAL_COMMUNITY): Payer: Self-pay

## 2020-02-15 DIAGNOSIS — R634 Abnormal weight loss: Secondary | ICD-10-CM | POA: Diagnosis present

## 2020-02-15 DIAGNOSIS — Z885 Allergy status to narcotic agent status: Secondary | ICD-10-CM | POA: Diagnosis not present

## 2020-02-15 DIAGNOSIS — I10 Essential (primary) hypertension: Secondary | ICD-10-CM | POA: Diagnosis not present

## 2020-02-15 DIAGNOSIS — E785 Hyperlipidemia, unspecified: Secondary | ICD-10-CM | POA: Diagnosis present

## 2020-02-15 DIAGNOSIS — R000 Tachycardia, unspecified: Secondary | ICD-10-CM | POA: Diagnosis present

## 2020-02-15 DIAGNOSIS — Z9884 Bariatric surgery status: Secondary | ICD-10-CM

## 2020-02-15 DIAGNOSIS — Z888 Allergy status to other drugs, medicaments and biological substances status: Secondary | ICD-10-CM

## 2020-02-15 DIAGNOSIS — G4733 Obstructive sleep apnea (adult) (pediatric): Secondary | ICD-10-CM | POA: Diagnosis present

## 2020-02-15 DIAGNOSIS — I2510 Atherosclerotic heart disease of native coronary artery without angina pectoris: Secondary | ICD-10-CM | POA: Diagnosis present

## 2020-02-15 DIAGNOSIS — B948 Sequelae of other specified infectious and parasitic diseases: Secondary | ICD-10-CM

## 2020-02-15 DIAGNOSIS — Z79899 Other long term (current) drug therapy: Secondary | ICD-10-CM

## 2020-02-15 DIAGNOSIS — R0602 Shortness of breath: Secondary | ICD-10-CM | POA: Diagnosis not present

## 2020-02-15 DIAGNOSIS — E1165 Type 2 diabetes mellitus with hyperglycemia: Secondary | ICD-10-CM | POA: Diagnosis present

## 2020-02-15 DIAGNOSIS — E669 Obesity, unspecified: Secondary | ICD-10-CM | POA: Diagnosis present

## 2020-02-15 DIAGNOSIS — Z794 Long term (current) use of insulin: Secondary | ICD-10-CM | POA: Diagnosis not present

## 2020-02-15 DIAGNOSIS — Z886 Allergy status to analgesic agent status: Secondary | ICD-10-CM

## 2020-02-15 DIAGNOSIS — R0902 Hypoxemia: Secondary | ICD-10-CM | POA: Diagnosis present

## 2020-02-15 DIAGNOSIS — Z6837 Body mass index (BMI) 37.0-37.9, adult: Secondary | ICD-10-CM | POA: Diagnosis not present

## 2020-02-15 DIAGNOSIS — IMO0002 Reserved for concepts with insufficient information to code with codable children: Secondary | ICD-10-CM | POA: Diagnosis present

## 2020-02-15 DIAGNOSIS — I2692 Saddle embolus of pulmonary artery without acute cor pulmonale: Principal | ICD-10-CM | POA: Diagnosis present

## 2020-02-15 DIAGNOSIS — E1169 Type 2 diabetes mellitus with other specified complication: Secondary | ICD-10-CM | POA: Diagnosis present

## 2020-02-15 DIAGNOSIS — E782 Mixed hyperlipidemia: Secondary | ICD-10-CM | POA: Diagnosis present

## 2020-02-15 DIAGNOSIS — I119 Hypertensive heart disease without heart failure: Secondary | ICD-10-CM | POA: Diagnosis present

## 2020-02-15 DIAGNOSIS — Z87891 Personal history of nicotine dependence: Secondary | ICD-10-CM

## 2020-02-15 DIAGNOSIS — Z880 Allergy status to penicillin: Secondary | ICD-10-CM

## 2020-02-15 LAB — COMPREHENSIVE METABOLIC PANEL
ALT: 26 U/L (ref 0–44)
AST: 19 U/L (ref 15–41)
Albumin: 3.6 g/dL (ref 3.5–5.0)
Alkaline Phosphatase: 114 U/L (ref 38–126)
Anion gap: 11 (ref 5–15)
BUN: 5 mg/dL — ABNORMAL LOW (ref 6–20)
CO2: 27 mmol/L (ref 22–32)
Calcium: 8.8 mg/dL — ABNORMAL LOW (ref 8.9–10.3)
Chloride: 102 mmol/L (ref 98–111)
Creatinine, Ser: 0.58 mg/dL (ref 0.44–1.00)
GFR calc Af Amer: >60 mL/min (ref >60)
GFR calc non Af Amer: >60 mL/min (ref >60)
Glucose, Bld: 309 mg/dL — ABNORMAL HIGH (ref 70–99)
Potassium: 3.7 mmol/L (ref 3.5–5.1)
Sodium: 140 mmol/L (ref 135–145)
Total Bilirubin: 0.6 mg/dL (ref 0.3–1.2)
Total Protein: 7.4 g/dL (ref 6.5–8.1)

## 2020-02-15 LAB — GLUCOSE, CAPILLARY: Glucose-Capillary: 374 mg/dL — ABNORMAL HIGH (ref 70–99)

## 2020-02-15 LAB — CBC WITH DIFFERENTIAL/PLATELET
Abs Immature Granulocytes: 0.02 10*3/uL (ref 0.00–0.07)
Basophils Absolute: 0 10*3/uL (ref 0.0–0.1)
Basophils Relative: 1 %
Eosinophils Absolute: 0.2 10*3/uL (ref 0.0–0.5)
Eosinophils Relative: 2 %
HCT: 43.4 % (ref 36.0–46.0)
Hemoglobin: 13.5 g/dL (ref 12.0–15.0)
Immature Granulocytes: 0 %
Lymphocytes Relative: 21 %
Lymphs Abs: 1.4 10*3/uL (ref 0.7–4.0)
MCH: 27.7 pg (ref 26.0–34.0)
MCHC: 31.1 g/dL (ref 30.0–36.0)
MCV: 88.9 fL (ref 80.0–100.0)
Monocytes Absolute: 0.5 10*3/uL (ref 0.1–1.0)
Monocytes Relative: 7 %
Neutro Abs: 4.6 10*3/uL (ref 1.7–7.7)
Neutrophils Relative %: 69 %
Platelets: 193 10*3/uL (ref 150–400)
RBC: 4.88 MIL/uL (ref 3.87–5.11)
RDW: 14.2 % (ref 11.5–15.5)
WBC: 6.6 10*3/uL (ref 4.0–10.5)
nRBC: 0 % (ref 0.0–0.2)

## 2020-02-15 LAB — BLOOD GAS, ARTERIAL
Acid-Base Excess: 2.1 mmol/L — ABNORMAL HIGH (ref 0.0–2.0)
Bicarbonate: 25.9 mmol/L (ref 20.0–28.0)
O2 Saturation: 92.8 %
Patient temperature: 98.6
pCO2 arterial: 39.1 mmHg (ref 32.0–48.0)
pH, Arterial: 7.435 (ref 7.350–7.450)
pO2, Arterial: 64.4 mmHg — ABNORMAL LOW (ref 83.0–108.0)

## 2020-02-15 LAB — MRSA PCR SCREENING: MRSA by PCR: NEGATIVE

## 2020-02-15 LAB — BRAIN NATRIURETIC PEPTIDE: B Natriuretic Peptide: 34.3 pg/mL (ref 0.0–100.0)

## 2020-02-15 LAB — TROPONIN I (HIGH SENSITIVITY)
Troponin I (High Sensitivity): 17 ng/L (ref <18)
Troponin I (High Sensitivity): 19 ng/L — ABNORMAL HIGH (ref <18)

## 2020-02-15 LAB — LACTIC ACID, PLASMA: Lactic Acid, Venous: 2.4 mmol/L (ref 0.5–1.9)

## 2020-02-15 MED ORDER — HEPARIN BOLUS VIA INFUSION
4000.0000 [IU] | Freq: Once | INTRAVENOUS | Status: AC
  Administered 2020-02-15: 4000 [IU] via INTRAVENOUS
  Filled 2020-02-15: qty 4000

## 2020-02-15 MED ORDER — ACETAMINOPHEN 650 MG RE SUPP: 650.0000 mg | Freq: Four times a day (QID) | RECTAL | Status: DC | PRN

## 2020-02-15 MED ORDER — INSULIN ASPART 100 UNIT/ML ~~LOC~~ SOLN: 0.0000 [IU] | Freq: Three times a day (TID) | SUBCUTANEOUS | Status: DC

## 2020-02-15 MED ORDER — SODIUM CHLORIDE (PF) 0.9 % IJ SOLN
INTRAMUSCULAR | Status: AC
  Filled 2020-02-15: qty 50

## 2020-02-15 MED ORDER — INSULIN GLARGINE 100 UNIT/ML ~~LOC~~ SOLN
30.0000 [IU] | Freq: Every day | SUBCUTANEOUS | Status: DC
  Administered 2020-02-16 – 2020-02-17 (×2): 30 [IU] via SUBCUTANEOUS
  Filled 2020-02-15 (×2): qty 0.3

## 2020-02-15 MED ORDER — METHYLPREDNISOLONE SODIUM SUCC 125 MG IJ SOLR
125.0000 mg | Freq: Once | INTRAMUSCULAR | Status: AC
  Administered 2020-02-15: 125 mg via INTRAVENOUS
  Filled 2020-02-15: qty 2

## 2020-02-15 MED ORDER — FUROSEMIDE 20 MG PO TABS
20.0000 mg | ORAL_TABLET | Freq: Every day | ORAL | Status: DC
  Administered 2020-02-16 – 2020-02-17 (×2): 20 mg via ORAL
  Filled 2020-02-15 (×2): qty 1

## 2020-02-15 MED ORDER — HYDRALAZINE HCL 20 MG/ML IJ SOLN: 10.0000 mg | Freq: Four times a day (QID) | INTRAMUSCULAR | Status: DC | PRN

## 2020-02-15 MED ORDER — POLYETHYLENE GLYCOL 3350 17 G PO PACK: 17.0000 g | PACK | Freq: Every day | ORAL | Status: DC | PRN

## 2020-02-15 MED ORDER — HEPARIN (PORCINE) 25000 UT/250ML-% IV SOLN
1700.0000 [IU]/h | INTRAVENOUS | Status: DC
  Administered 2020-02-15: 1000 [IU]/h via INTRAVENOUS
  Filled 2020-02-15: qty 250

## 2020-02-15 MED ORDER — ONDANSETRON HCL 4 MG PO TABS: 4.0000 mg | ORAL_TABLET | Freq: Four times a day (QID) | ORAL | Status: DC | PRN

## 2020-02-15 MED ORDER — IOHEXOL 350 MG/ML SOLN
100.0000 mL | Freq: Once | INTRAVENOUS | Status: AC | PRN
  Administered 2020-02-15: 100 mL via INTRAVENOUS

## 2020-02-15 MED ORDER — ORAL CARE MOUTH RINSE
15.0000 mL | Freq: Two times a day (BID) | OROMUCOSAL | Status: DC
  Administered 2020-02-15 – 2020-02-17 (×4): 15 mL via OROMUCOSAL

## 2020-02-15 MED ORDER — ATORVASTATIN CALCIUM 20 MG PO TABS
20.0000 mg | ORAL_TABLET | Freq: Every day | ORAL | Status: DC
  Administered 2020-02-16 – 2020-02-17 (×2): 20 mg via ORAL
  Filled 2020-02-15: qty 1
  Filled 2020-02-15: qty 2

## 2020-02-15 MED ORDER — DILTIAZEM HCL 30 MG PO TABS
30.0000 mg | ORAL_TABLET | Freq: Four times a day (QID) | ORAL | Status: AC
  Administered 2020-02-15 – 2020-02-16 (×2): 30 mg via ORAL
  Filled 2020-02-15 (×2): qty 1

## 2020-02-15 MED ORDER — CHLORHEXIDINE GLUCONATE CLOTH 2 % EX PADS
6.0000 | MEDICATED_PAD | Freq: Every day | CUTANEOUS | Status: DC
  Administered 2020-02-16: 6 via TOPICAL

## 2020-02-15 MED ORDER — DILTIAZEM HCL ER COATED BEADS 120 MG PO CP24
120.0000 mg | ORAL_CAPSULE | Freq: Every day | ORAL | Status: DC
  Administered 2020-02-16 – 2020-02-17 (×2): 120 mg via ORAL
  Filled 2020-02-15 (×2): qty 1

## 2020-02-15 MED ORDER — INSULIN ASPART 100 UNIT/ML ~~LOC~~ SOLN
0.0000 [IU] | Freq: Three times a day (TID) | SUBCUTANEOUS | Status: DC
  Administered 2020-02-15: 15 [IU] via SUBCUTANEOUS

## 2020-02-15 MED ORDER — ALBUTEROL SULFATE HFA 108 (90 BASE) MCG/ACT IN AERS
2.0000 | INHALATION_SPRAY | RESPIRATORY_TRACT | Status: DC | PRN
  Administered 2020-02-15: 2 via RESPIRATORY_TRACT
  Filled 2020-02-15: qty 6.7

## 2020-02-15 MED ORDER — ONDANSETRON HCL 4 MG/2ML IJ SOLN: 4.0000 mg | Freq: Four times a day (QID) | INTRAMUSCULAR | Status: DC | PRN

## 2020-02-15 MED ORDER — SODIUM CHLORIDE 0.9 % IV SOLN: INTRAVENOUS | Status: AC

## 2020-02-15 MED ORDER — ACETAMINOPHEN 325 MG PO TABS
650.0000 mg | ORAL_TABLET | ORAL | Status: DC | PRN
  Administered 2020-02-17: 650 mg via ORAL
  Filled 2020-02-15: qty 2

## 2020-02-15 MED ORDER — ALBUTEROL SULFATE (2.5 MG/3ML) 0.083% IN NEBU: 2.5000 mg | INHALATION_SOLUTION | Freq: Four times a day (QID) | RESPIRATORY_TRACT | Status: DC | PRN

## 2020-02-15 NOTE — Progress Notes (Signed)
ANTICOAGULATION CONSULT NOTE - Initial Consult  Pharmacy Consult for heparin Indication: pulmonary embolus  Allergies  Allergen Reactions  . Aspirin Other (See Comments)    Seizures.   . Demerol Other (See Comments)    Seizures.   . Lisinopril Cough  . Losartan     Sneezing and coughing  . Morphine Other (See Comments)    Seizures  . Oxycodone Other (See Comments)    seizures  . Penicillins Other (See Comments)    Seizures.Has patient had a PCN reaction causing immediate rash, facial/tongue/throat swelling, SOB or lightheadedness with hypotension: Yes Has patient had a PCN reaction causing severe rash involving mucus membranes or skin necrosis: Yes Has patient had a PCN reaction that required hospitalizationYes Has patient had a PCN reaction occurring within the last 10 years: Yes If all of the above answers are "NO", then may proceed with Cephalosporin use.   Marland Kitchen Phentermine     History of seizure disorder  . Trazodone Other (See Comments)    Hallucinations    Patient Measurements: Height: 5\' 7"  (170.2 cm) Weight: 240 lb (108.9 kg) IBW/kg (Calculated) : 61.6 Heparin Dosing Weight: 100kg  Vital Signs: Temp: 98.3 F (36.8 C) (03/25 1539) BP: 173/113 (03/25 1730) Pulse Rate: 110 (03/25 1730)  Labs: Recent Labs    02/15/20 1601 02/15/20 1921  HGB 13.5  --   HCT 43.4  --   PLT 193  --   CREATININE 0.58  --   TROPONINIHS  --  17    Estimated Creatinine Clearance: 98.6 mL/min (by C-G formula based on SCr of 0.58 mg/dL).   Medical History: Past Medical History:  Diagnosis Date  . Coronary artery disease involving native coronary artery of native heart without angina pectoris 01/21/2020  . Diabetes mellitus   . Essential hypertension 01/17/2012  . Hypertension   . Pneumonia due to COVID-19 virus 01/20/2020  . Seizures (HCC)   . 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     Assessment:  59 y.o. female presents with shortness of breath.  Patient was treated for Covid in  the hospital a month ago. No prior AC, pharmacy to dose heparin for PE.   CBC WNL Scr WNL  Goal of Therapy:  Heparin level 0.3-0.7 units/ml Monitor platelets by anticoagulation protocol   Plan:  Heparin bolus 4000 units then start heparin drip at 1700 units/hr Heparin level in 6 hours Daily CBC   59 RPh 02/15/2020, 8:34 PM

## 2020-02-15 NOTE — H&P (Addendum)
History and Physical    Sierra Ellis IRJ:[id number] DOB: Aug 28, 1961 DOA: 02/15/2020  PCP: Bartholome Bill, MD  Patient coming from: Home   Chief Complaint:  Chief Complaint  Patient presents with  . Shortness of Breath     HPI:    59 year old female with past medical history of hypertension, diabetes mellitus type 2, obesity and recent COVID-19 pneumonia presents to [facility] long hospital with complaints of shortness of breath and weakness.  Patient explains that last Thursday she began to experience generalized weakness and shortness of breath.  Shortness of breath was initially mild in intensity progressively worsened over the next several days.  Patient states that her shortness of breath is now severe in intensity and worse with minimal exertion.  Patient is also complaining of associated dry nonproductive cough and poor appetite.  Patient denies sick contacts, fevers, chest pain, recent long distance travel or recent surgeries.  Patient does report a remote history an abnormal 2011 saying that "they froze it all out" of colposcopy with cryoablation.  Patient denies night sweats but does endorse significant weight loss in the past 1 month since her diagnosis of COVID-19.    Patient symptoms continue to worsen until she eventually presented to [facility] long emergency department for evaluation.  Upon evaluation in the emergency department CT imaging of the chest revealed bilateral pulmonary emboli including saddle embolus at the bifurcation of the left pulmonary artery as well as bilateral lower lobe pulmonary emboli.  Patient was initiated on heparin infusion at the hospital prescription was called to assess the patient for admission the hospital.   Review of Systems: A 10-system review of systems has been performed and all systems are negative with the exception of the following:    Endocrine: Elevated daily blood sugars General: Endorses significant weight loss over last several  weeks    Past Medical History:  Diagnosis Date  . Abnormal Pap smear of cervix 2011   S/P "freezing it all out" likely colposcopy with cryoablation  . Coronary artery disease involving native coronary artery of native heart without angina pectoris 01/21/2020  . Diabetes mellitus   . Essential hypertension 01/17/2012  . Pneumonia due to COVID-19 virus 01/20/2020  . Seizures ([place])   . 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/17/2012  . 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    Past Surgical History:  Procedure Laterality Date  . GASTRIC BYPASS     one band has broke and was noted on her last colonoscopy     reports that she has quit smoking. She has never used smokeless tobacco. She reports that she does not drink alcohol or use drugs.  Allergies  Allergen Reactions  . Aspirin Other (See Comments)    Seizures.   . Demerol Other (See Comments)    Seizures.   . Lisinopril Cough  . Losartan     Sneezing and coughing  . Morphine Other (See Comments)    Seizures  . Oxycodone Other (See Comments)    seizures  . Penicillins Other (See Comments)    Seizures.Has patient had a PCN reaction causing immediate rash, facial/tongue/throat swelling, SOB or lightheadedness with hypotension: Yes Has patient had a PCN reaction causing severe rash involving mucus membranes or skin necrosis: Yes Has patient had a PCN reaction that required hospitalizationYes Has patient had a PCN reaction occurring within the last 10 years: Yes If all of the above answers are "NO", then may proceed with Cephalosporin use.   Marland Kitchen Phentermine  History of seizure disorder  . Trazodone Other (See Comments)    Hallucinations    Family History  Problem Relation Age of Onset  . Stroke Mother   . Stroke Father      Prior to Admission medications   Medication Sig Start Date End Date Taking? Authorizing Provider  albuterol (VENTOLIN HFA) 108 (90 Base) MCG/ACT inhaler Inhale 2 puffs into the lungs every 6 (six) hours  as needed for wheezing or shortness of breath. 01/25/20  Yes Oretha Milch D, MD  amLODipine (NORVASC) 5 MG tablet Take 5 mg by mouth daily. 01/25/20  Yes [provider]  atorvastatin (LIPITOR) 20 MG tablet Take 20 mg by mouth daily. 01/02/20  Yes [provider]  cholecalciferol (VITAMIN D3) 25 MCG (1000 UT) tablet Take 1,000 Units by mouth daily.   Yes [provider]  Dextromethorphan Polistirex (DELSYM PO) Take 1 tablet by mouth daily as needed (cough).   Yes [provider]  diltiazem (CARDIZEM CD) 120 MG 24 hr capsule Take 120 mg by mouth daily.  01/25/20  Yes [provider]  furosemide (LASIX) 20 MG tablet Take 20 mg by mouth daily.  01/02/20  Yes [provider]  guaiFENesin-dextromethorphan (ROBITUSSIN DM) 100-10 MG/5ML syrup Take 10 mLs by mouth every 4 (four) hours as needed for cough. 01/25/20  Yes Oretha Milch D, MD  KLOR-CON M20 20 MEQ tablet Take 20 mEq by mouth daily. 01/02/20  Yes [provider]  LANTUS SOLOSTAR 100 UNIT/ML Solostar Pen Take 30 U in the evening while taking decadron Resume taking 24 U on 3/9 Patient taking differently: Inject 25 Units into the skin at bedtime.  01/25/20  Yes Oretha Milch D, MD  meclizine (ANTIVERT) 25 MG tablet Take 1 tablet (25 mg total) by mouth 3 (three) times daily as needed for dizziness. 07/20/19  Yes Petrucelli, Samantha R, PA-C  metFORMIN (GLUCOPHAGE) 500 MG tablet Take 500-1,500 mg by mouth See admin instructions. 533m in AM and [id number]   Yes [provider]  vitamin C (ASCORBIC ACID) 250 MG tablet Take 250 mg by mouth daily.   Yes [provider]  zinc sulfate 220 (50 Zn) MG capsule Take 1 capsule (220 mg total) by mouth daily. 01/25/20  Yes NeOretha Milch, MD  diltiazem (CARDIZEM CD) 180 MG 24 hr capsule Take 1 capsule (180 mg total) by mouth daily. Patient not taking: Reported on 02/15/2020 01/25/20   NeOretha Milch, MD  nystatin cream (MYCOSTATIN) Apply to  affected area 2 times daily, do not insert into vagina Patient not taking: Reported on 02/15/2020 07/20/19   PeAmaryllis DykePA-C    Physical Exam: Vitals:   02/15/20 1630 02/15/20 1700 02/15/20 1730 02/15/20 2013  BP: (!) 162/90 (!) 147/86 (!) 173/113 119/88  Pulse: (!) 118 (!) 111 (!) 110 (!) 113  Resp: 15 (!) 27 15 (!) 25  Temp:      SpO2: 95% 99% 98% 98%  Weight:      Height:        Constitutional: Acute alert and oriented x3, no associated distress.   Skin: no rashes, no lesions, good skin turgor noted. Eyes: Pupils are equally reactive to light.  No evidence of scleral icterus or conjunctival pallor.  ENMT: Mucous membranes are moist. Posterior pharynx clear of any exudate or lesions. Normal dentition.   Neck: normal, supple, no masses, no thyromegaly Respiratory: clear to auscultation bilaterally, no wheezing no org, no crackles. Normal respiratory effort. No accessory  muscle use.  Cardiovascular: Regular rate and rhythm, no murmurs / rubs / gallops. No extremity edema. 2+ pedal pulses. No carotid bruits.  Abdomen: Abdomen is soft and nontender.  No evidence of intra-abdominal masses.  Positive bowel sounds noted in all quadrants.   Musculoskeletal: No joint deformity upper and lower extremities. Good ROM, no contractures. Normal muscle tone.  Neurologic: CN 2-12 grossly intact. Sensation intact, strength noted to be 5 out of 5 in all 4 extremities.  Patient is following all commands.  Patient is responsive to verbal stimuli.   Psychiatric: Patient presents as a normal mood with appropriate affect.  Patient seems to possess insight as to theircurrent situation.     Labs on Admission: I have personally reviewed following labs and imaging studies -   CBC: Recent Labs  Lab 02/15/20 1601  WBC 6.6  NEUTROABS 4.6  HGB 13.5  HCT 43.4  MCV 88.9  PLT 762   Basic Metabolic Panel: Recent Labs  Lab 02/15/20 1601  NA 140  K 3.7  CL 102  CO2 27  GLUCOSE 309*  BUN 5*    CREATININE 0.58  CALCIUM 8.8*   GFR: Estimated Creatinine Clearance: 98.6 mL/min (by C-G formula based on SCr of 0.58 mg/dL). Liver Function Tests: Recent Labs  Lab 02/15/20 1601  AST 19  ALT 26  ALKPHOS 114  BILITOT 0.6  PROT 7.4  ALBUMIN 3.6   No results for input(s): LIPASE, AMYLASE in the last 168 hours. No results for input(s): AMMONIA in the last 168 hours. Coagulation Profile: No results for input(s): INR, PROTIME in the last 168 hours. Cardiac Enzymes: No results for input(s): CKTOTAL, CKMB, CKMBINDEX, TROPONINI in the last 168 hours. BNP (last 3 results) No results for input(s): PROBNP in the last 8760 hours. HbA1C: No results for input(s): HGBA1C in the last 72 hours. CBG: No results for input(s): GLUCAP in the last 168 hours. Lipid Profile: No results for input(s): CHOL, HDL, LDLCALC, TRIG, CHOLHDL, LDLDIRECT in the last 72 hours. Thyroid Function Tests: No results for input(s): TSH, T4TOTAL, FREET4, T3FREE, THYROIDAB in the last 72 hours. Anemia Panel: No results for input(s): VITAMINB12, FOLATE, FERRITIN, TIBC, IRON, RETICCTPCT in the last 72 hours. Urine analysis:    Component Value Date/Time   COLORURINE YELLOW 07/20/2019 1003   APPEARANCEUR CLEAR 07/20/2019 1003   LABSPEC 1.037 (H) 07/20/2019 1003   PHURINE 5.0 07/20/2019 1003   GLUCOSEU >=500 (A) 07/20/2019 1003   HGBUR NEGATIVE 07/20/2019 1003   BILIRUBINUR NEGATIVE 07/20/2019 1003   KETONESUR 20 (A) 07/20/2019 1003   PROTEINUR NEGATIVE 07/20/2019 1003   UROBILINOGEN 1.0 01/17/2012 1641   NITRITE NEGATIVE 07/20/2019 1003   LEUKOCYTESUR MODERATE (A) 07/20/2019 1003    Radiological Exams on Admission: CT Angio Chest PE W and/or Wo Contrast  Result Date: 02/15/2020 CLINICAL DATA:  Shortness of breath, tested COVID-19 positive through weeks ago, recent hospital admission and discharge, short of breath for 2 days, history hypertension, diabetes mellitus EXAM: CT ANGIOGRAPHY CHEST WITH CONTRAST  TECHNIQUE: Multidetector CT imaging of the chest was performed using the standard protocol during bolus administration of intravenous contrast. Multiplanar CT image reconstructions and MIPs were obtained to evaluate the vascular anatomy. CONTRAST:  140m OMNIPAQUE IOHEXOL 350 MG/ML SOLN IV COMPARISON:  None FINDINGS: Cardiovascular: Atherosclerotic calcifications aorta. No aortic aneurysm or dissection. No pericardial effusion. Pulmonary arteries adequately opacified. Filling defects are seen within pulmonary arteries bilaterally consistent with pulmonary embolism. These include a saddle embolus at the bifurcation of LEFT pulmonary  artery extending into upper and lower lobe. Additional BILATERAL lower lobe pulmonary emboli. Normal RV/LV ratio = 0.75 Mediastinum/Nodes: Base of cervical region normal appearance. Few normal size mediastinal lymph nodes. No thoracic adenopathy. Esophagus unremarkable. Lungs/Pleura: Respiratory motion artifacts at lower lungs. Linear subsegmental atelectasis at lingula and posterior RIGHT upper lobe. Minimal nonspecific mosaic attenuation without definite pulmonary infiltrate. No pleural effusion or pneumothorax. Upper Abdomen: Unremarkable Musculoskeletal: Unremarkable Review of the MIP images confirms the above findings. IMPRESSION: BILATERAL pulmonary emboli including a saddle embolus at the bifurcation of LEFT pulmonary artery as well as BILATERAL lower lobe pulmonary emboli. Normal RV/LV ratio = 0.75 Aortic Atherosclerosis (ICD10-I70.0). Critical Value/emergent results were called by telephone at the time of interpretation on 02/15/2020 at 6:35 pm to provider JOSEPH ZAMMIT , who verbally acknowledged these results. Electronically Signed   By: Lavonia Dana M.D.   On: 02/15/2020 18:38    EKG: Personally reviewed.  Rhythm is sinus tachycardia with heart rate of 125 beats minute with no evidence of dynamic ST segment changes appreciated.  Assessment/Plan   Active Problems:    Acute saddle pulmonary embolism without acute cor pulmonale (HCC)  While patient is presenting with bilateral pulmonary emboli with evidence of saddle embolus of bifurcation of left pulmonary artery, patient is chest pain-free, not in respiratory distress, only requiring 2 L of oxygen via nasal cannula, exhibiting no evidence of hemodynamic instability and no evidence of significant right heart strain on CT imaging of the chest.  BNP and troponin are unremarkable in the emergency department.  Concerning the etiology, most likely cause of patient's recent infection with COVID-19.  While patient does have a history 2011 of an abnormal Pap smear there is no other obvious evidence of malignancy.  No history of long distance travel and no history of recent surgery.  Initially placing patient in stepdown unit for close clinical monitoring for any evidence of hemodynamic instability  Patient patient on heparin infusion which can later be transitioned to likely NOAC therapy in the coming days  Cycling cardiac enzymes  Monitoring patient on telemetry  Obtaining echocardiogram in the morning to evaluate for any definitive evidence of ventricular strain  Continue to provide supplemental oxygen    Diabetes mellitus type II, uncontrolled ([place])   Longstanding known history of uncontrolled diabetes mellitus type 2, last hemoglobin A1c of 12.4% just performed last month.  Restarted patient's home regimen of Lantus 30 units every morning per last discharge summary  Place patient Accu-Cheks before every meal and nightly with sliding scale insulin  Holding home regimen of metformin  Will titrate insulin therapy based on upcoming sugars     Essential hypertension  Continuing patient's home regimen of diltiazem.  Per review of home medication reconciliation it seems patient is taking both amlodipine and diltiazem, will discontinue amlodipine at this time.  Titrate antihypertensive therapy as  necessary  Provide patient with as needed intravenous antihypertensives were excessively elevated blood pressures.    Mixed diabetic hyperlipidemia associated with type 2 diabetes mellitus ([place])  Continue home regimen of statin therapy  History of COVID 19 infection   Recent history of COVID-19 infection and hospitalization  Active infection has resolved, isolation is no longer required.  COVID-19 PCR screening on admission has not been performed due to recent infection.     Code Status:  Full code Disposition Plan: Patient is anticipated to be discharged to home once patient has met maximum benefit from current hospitalization.   Consults called: None Admission status: Patient will  be admitted to Inpatient and is anticipated to remain in the hospital for greater than 2 midnights.   Vernelle Emerald MD [facility] Pager [phone]  If 7PM-7AM, please contact night-coverage [email or web address] Use universal [place] password for that web site. If you do not have the password, please call the hospital operator.  02/15/2020, 8:46 PM

## 2020-02-15 NOTE — ED Triage Notes (Signed)
Pt BIB [facility] from home with c/o SHOB. Pt tested COVID+ 3 weeks ago with admission to the hospital. Pt was d/c with COVID PNA. Pt states that she recently finished all her abx and medication and has been [facility] for 2 days. Pt has hx of asthma, has tried her albuterol inhaler at home with no relief. Per EMS she was 92% on RA and 96-100% on 2L. In ED pt was 95% on RA, however, was placed on 2L for comfort. Pt is tachycardic with HR of 130. Pt A&Ox4.

## 2020-02-15 NOTE — ED Provider Notes (Signed)
[facility] DEPT Provider Note   CSN: [id number] Arrival date & time: 02/15/20  1522     History Chief Complaint  Patient presents with  . Shortness of Breath    Sierra Ellis is a 59 y.o. female.  Patient complains of shortness of breath.  Patient was treated for Covid in the hospital a month ago.  The history is provided by the patient. No language interpreter was used.  Shortness of Breath Severity:  Moderate Onset quality:  Sudden Timing:  Constant Progression:  Worsening Chronicity:  New Context: not activity   Relieved by:  Nothing Worsened by:  Nothing Ineffective treatments:  None tried Associated symptoms: no abdominal pain, no chest pain, no cough, no headaches and no rash        Past Medical History:  Diagnosis Date  . Diabetes mellitus   . Hypertension   . Seizures [facility])     Patient Active Problem List   Diagnosis Date Noted  . Severe obesity (BMI >= 40) ([place]) 01/24/2020  . Acute respiratory failure with hypoxia ([place]) 01/24/2020  . Hypokalemia 01/21/2020  . CAD (coronary artery disease) 01/21/2020  . OSA (obstructive sleep apnea) 01/21/2020  . Pneumonia due to COVID-19 virus 01/20/2020  . Nonepileptic episode ([place]) 01/18/2012  . 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 Type 2 diabetes mellitus with hyperlipidemia ([place]) 01/17/2012  . Hypertension 01/17/2012  . Hyperlipidemia 01/17/2012  . GERD (gastroesophageal reflux disease) 01/17/2012  . Abdominal pain 01/17/2012  . 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/17/2012    Past Surgical History:  Procedure Laterality Date  . GASTRIC BYPASS     one band has broke and was noted on her last colonoscopy     OB History   No obstetric history on file.     History reviewed. No pertinent family history.  Social History   Tobacco Use  . Smoking status: Former Research scientist (life sciences)  . Smokeless tobacco: Never Used  Substance Use Topics  . Alcohol use: No  . Drug use: No    Home  Medications Prior to Admission medications   Medication Sig Start Date End Date Taking? Authorizing Provider  albuterol (VENTOLIN HFA) 108 (90 Base) MCG/ACT inhaler Inhale 2 puffs into the lungs every 6 (six) hours as needed for wheezing or shortness of breath. 01/25/20  Yes Oretha Milch D, MD  amLODipine (NORVASC) 5 MG tablet Take 5 mg by mouth daily. 01/25/20  Yes [provider]  atorvastatin (LIPITOR) 20 MG tablet Take 20 mg by mouth daily. 01/02/20  Yes [provider]  cholecalciferol (VITAMIN D3) 25 MCG (1000 UT) tablet Take 1,000 Units by mouth daily.   Yes [provider]  Dextromethorphan Polistirex (DELSYM PO) Take 1 tablet by mouth daily as needed (cough).   Yes [provider]  diltiazem (CARDIZEM CD) 120 MG 24 hr capsule Take 120 mg by mouth daily.  01/25/20  Yes [provider]  furosemide (LASIX) 20 MG tablet Take 20 mg by mouth daily.  01/02/20  Yes [provider]  guaiFENesin-dextromethorphan (ROBITUSSIN DM) 100-10 MG/5ML syrup Take 10 mLs by mouth every 4 (four) hours as needed for cough. 01/25/20  Yes Oretha Milch D, MD  KLOR-CON M20 20 MEQ tablet Take 20 mEq by mouth daily. 01/02/20  Yes [provider]  LANTUS SOLOSTAR 100 UNIT/ML Solostar Pen Take 30 U in the evening while taking decadron Resume taking 24 U on 3/9 Patient taking differently: Inject 25 Units into the skin at bedtime.  01/25/20  Yes Laverna Peace, MD  meclizine (ANTIVERT) 25 MG tablet Take 1 tablet (25 mg total) by mouth 3 (three) times daily as needed for dizziness. 07/20/19  Yes Petrucelli, Samantha R, PA-C  metFORMIN (GLUCOPHAGE) 500 MG tablet Take 500-1,500 mg by mouth See admin instructions. 500mg  in AM and 1500mg  qhs   Yes [provider]  vitamin C (ASCORBIC ACID) 250 MG tablet Take 250 mg by mouth daily.   Yes [provider]  zinc sulfate 220 (50 Zn) MG capsule Take 1 capsule (220 mg total) by mouth daily. 01/25/20  Yes D, MD  diltiazem (CARDIZEM CD) 180 MG 24 hr capsule Take 1 capsule (180 mg total) by mouth daily. Patient not taking: Reported on 02/15/2020 01/25/20   02/17/2020 D, MD  nystatin cream (MYCOSTATIN) Apply to affected area 2 times daily, do not insert into vagina Patient not taking: Reported on 02/15/2020 07/20/19   Petrucelli, 02/17/2020 R, PA-C    Allergies    Aspirin, Demerol, Lisinopril, Losartan, Morphine, Oxycodone, Penicillins, Phentermine, and Trazodone  Review of Systems   Review of Systems  Constitutional: Negative for appetite change and fatigue.  HENT: Negative for congestion, ear discharge and sinus pressure.   Eyes: Negative for discharge.  Respiratory: Positive for shortness of breath. Negative for cough.   Cardiovascular: Negative for chest pain.  Gastrointestinal: Negative for abdominal pain and diarrhea.  Genitourinary: Negative for frequency and hematuria.  Musculoskeletal: Negative for back pain.  Skin: Negative for rash.  Neurological: Negative for seizures and headaches.  Psychiatric/Behavioral: Negative for hallucinations.    Physical Exam Updated Vital Signs BP (!) 173/113   Pulse (!) 110   Temp 98.3 F (36.8 C)   Resp 15   Ht 5\' 7"  (1.702 m)   Wt 108.9 kg   LMP 06/07/2019 (Approximate)   SpO2 98%   BMI 37.59 kg/m   Physical Exam Vitals and nursing note reviewed.  Constitutional:      Appearance: She is well-developed.  HENT:     Head: Normocephalic.     Nose: Nose normal.  Eyes:     General: No scleral icterus.    Conjunctiva/sclera: Conjunctivae normal.  Neck:     Thyroid: No thyromegaly.  Cardiovascular:     Rate and Rhythm: Normal rate and regular rhythm.     Heart sounds: No murmur. No friction rub. No gallop.   Pulmonary:     Breath sounds: No stridor. No wheezing or rales.     Comments: Tachypnea Chest:     Chest wall: No tenderness.  Abdominal:     General: There is no distension.     Tenderness: There is no abdominal  tenderness. There is no rebound.  Musculoskeletal:        General: Normal range of motion.     Cervical back: Neck supple.  Lymphadenopathy:     Cervical: No cervical adenopathy.  Skin:    Findings: No erythema or rash.  Neurological:     Mental Status: She is alert and oriented to person, place, and time.     Motor: No abnormal muscle tone.     Coordination: Coordination normal.  Psychiatric:        Behavior: Behavior normal.     ED Results / Procedures / Treatments   Labs (all labs ordered are listed, but only abnormal results are displayed) Labs Reviewed  COMPREHENSIVE METABOLIC PANEL - Abnormal; Notable for the following components:      Result Value  Glucose, Bld 309 (*)    BUN 5 (*)    Calcium 8.8 (*)    All other components within normal limits  CBC WITH DIFFERENTIAL/PLATELET  BRAIN NATRIURETIC PEPTIDE  TROPONIN I (HIGH SENSITIVITY)    EKG None  Radiology CT Angio Chest PE W and/or Wo Contrast  Result Date: 02/15/2020 CLINICAL DATA:  Shortness of breath, tested COVID-19 positive through weeks ago, recent hospital admission and discharge, short of breath for 2 days, history hypertension, diabetes mellitus EXAM: CT ANGIOGRAPHY CHEST WITH CONTRAST TECHNIQUE: Multidetector CT imaging of the chest was performed using the standard protocol during bolus administration of intravenous contrast. Multiplanar CT image reconstructions and MIPs were obtained to evaluate the vascular anatomy. CONTRAST:  OMNIPAQUE IOHEXOL 350 MG/ML SOLN IV COMPARISON:  None FINDINGS: Cardiovascular: Atherosclerotic calcifications aorta. No aortic aneurysm or dissection. No pericardial effusion. Pulmonary arteries adequately opacified. Filling defects are seen within pulmonary arteries bilaterally consistent with pulmonary embolism. These include a saddle embolus at the bifurcation of LEFT pulmonary artery extending into upper and lower lobe. Additional BILATERAL lower lobe pulmonary emboli. Normal  RV/LV ratio = 0.75 Mediastinum/Nodes: Base of cervical region normal appearance. Few normal size mediastinal lymph nodes. No thoracic adenopathy. Esophagus unremarkable. Lungs/Pleura: Respiratory motion artifacts at lower lungs. Linear subsegmental atelectasis at lingula and posterior RIGHT upper lobe. Minimal nonspecific mosaic attenuation without definite pulmonary infiltrate. No pleural effusion or pneumothorax. Upper Abdomen: Unremarkable Musculoskeletal: Unremarkable Review of the MIP images confirms the above findings. IMPRESSION: BILATERAL pulmonary emboli including a saddle embolus at the bifurcation of LEFT pulmonary artery as well as BILATERAL lower lobe pulmonary emboli. Normal RV/LV ratio = 0.75 Aortic Atherosclerosis (ICD10-I70.0). Critical Value/emergent results were called by telephone at the time of interpretation on 02/15/2020 at 6:35 pm to provider Makyna Niehoff , who verbally acknowledged these results. Electronically Signed   By: Ulyses Southward M.D.   On: 02/15/2020 18:38    Procedures Procedures (including critical care time)  Medications Ordered in ED Medications  albuterol (VENTOLIN HFA) 108 (90 Base) MCG/ACT inhaler 2 puff (2 puffs Inhalation Given 02/15/20 1549)  sodium chloride (PF) 0.9 % injection (has no administration in time range)  heparin bolus via infusion 4,000 Units (has no administration in time range)  heparin ADULT infusion 100 units/mL (25000 units/261mL sodium chloride 0.45%) (has no administration in time range)  methylPREDNISolone sodium succinate (SOLU-MEDROL) 125 mg/2 mL injection 125 mg (125 mg Intravenous Given 02/15/20 1559)  iohexol (OMNIPAQUE) 350 MG/ML injection 100 mL (100 mLs Intravenous Contrast Given 02/15/20 1809)    ED Course  I have reviewed the triage vital signs and the nursing notes.  Pertinent labs & imaging results that were available during my care of the patient were reviewed by me and considered in my medical decision making (see chart for  details).    MDM Rules/Calculators/A&P                     CRITICAL CARE Performed by: Bethann Berkshire Total critical care time:45 minutes Critical care time was exclusive of separately billable procedures and treating other patients. Critical care was necessary to treat or prevent imminent or life-threatening deterioration. Critical care was time spent personally by me on the following activities: development of treatment plan with patient and/or surrogate as well as nursing, discussions with consultants, evaluation of patient's response to treatment, examination of patient, obtaining history from patient or surrogate, ordering and performing treatments and interventions, ordering and review of laboratory studies, ordering and review  of radiographic studies, pulse oximetry and re-evaluation of patient's condition. Patient with large pulmonary embolus.  No right heart strain.  Patient will be admitted to medicine and started on heparin Final Clinical Impression(s) / ED Diagnoses Final diagnoses:  Acute saddle pulmonary embolism, unspecified whether acute cor pulmonale present [facility])    Rx / DC Orders ED Discharge Orders    None       Bethann Berkshire, MD 02/15/20 [phone]

## 2020-02-16 ENCOUNTER — Other Ambulatory Visit: Payer: Self-pay

## 2020-02-16 ENCOUNTER — Inpatient Hospital Stay (HOSPITAL_COMMUNITY): Payer: Medicare HMO

## 2020-02-16 DIAGNOSIS — I2692 Saddle embolus of pulmonary artery without acute cor pulmonale: Principal | ICD-10-CM

## 2020-02-16 DIAGNOSIS — E1169 Type 2 diabetes mellitus with other specified complication: Secondary | ICD-10-CM

## 2020-02-16 DIAGNOSIS — E782 Mixed hyperlipidemia: Secondary | ICD-10-CM

## 2020-02-16 DIAGNOSIS — I10 Essential (primary) hypertension: Secondary | ICD-10-CM

## 2020-02-16 DIAGNOSIS — E1165 Type 2 diabetes mellitus with hyperglycemia: Secondary | ICD-10-CM

## 2020-02-16 DIAGNOSIS — R0602 Shortness of breath: Secondary | ICD-10-CM

## 2020-02-16 LAB — CBC WITH DIFFERENTIAL/PLATELET
Abs Immature Granulocytes: 0.03 10*3/uL (ref 0.00–0.07)
Basophils Absolute: 0 10*3/uL (ref 0.0–0.1)
Basophils Relative: 0 %
Eosinophils Absolute: 0 10*3/uL (ref 0.0–0.5)
Eosinophils Relative: 0 %
HCT: 39.4 % (ref 36.0–46.0)
Hemoglobin: 12.3 g/dL (ref 12.0–15.0)
Immature Granulocytes: 0 %
Lymphocytes Relative: 13 %
Lymphs Abs: 1 10*3/uL (ref 0.7–4.0)
MCH: 27.8 pg (ref 26.0–34.0)
MCHC: 31.2 g/dL (ref 30.0–36.0)
MCV: 89.1 fL (ref 80.0–100.0)
Monocytes Absolute: 0.2 10*3/uL (ref 0.1–1.0)
Monocytes Relative: 2 %
Neutro Abs: 6.7 10*3/uL (ref 1.7–7.7)
Neutrophils Relative %: 85 %
Platelets: 187 10*3/uL (ref 150–400)
RBC: 4.42 MIL/uL (ref 3.87–5.11)
RDW: 14.1 % (ref 11.5–15.5)
WBC: 7.9 10*3/uL (ref 4.0–10.5)
nRBC: 0 % (ref 0.0–0.2)

## 2020-02-16 LAB — COMPREHENSIVE METABOLIC PANEL
ALT: 25 U/L (ref 0–44)
AST: 14 U/L — ABNORMAL LOW (ref 15–41)
Albumin: 3.3 g/dL — ABNORMAL LOW (ref 3.5–5.0)
Alkaline Phosphatase: 100 U/L (ref 38–126)
Anion gap: 12 (ref 5–15)
BUN: 8 mg/dL (ref 6–20)
CO2: 25 mmol/L (ref 22–32)
Calcium: 8.6 mg/dL — ABNORMAL LOW (ref 8.9–10.3)
Chloride: 101 mmol/L (ref 98–111)
Creatinine, Ser: 0.59 mg/dL (ref 0.44–1.00)
GFR calc Af Amer: >60 mL/min (ref >60)
GFR calc non Af Amer: >60 mL/min (ref >60)
Glucose, Bld: 404 mg/dL — ABNORMAL HIGH (ref 70–99)
Potassium: 3.8 mmol/L (ref 3.5–5.1)
Sodium: 138 mmol/L (ref 135–145)
Total Bilirubin: 0.7 mg/dL (ref 0.3–1.2)
Total Protein: 6.9 g/dL (ref 6.5–8.1)

## 2020-02-16 LAB — HEPARIN LEVEL (UNFRACTIONATED)
Heparin Unfractionated: 0.97 IU/mL — ABNORMAL HIGH (ref 0.30–0.70)
Heparin Unfractionated: 0.79 IU/mL — ABNORMAL HIGH (ref 0.30–0.70)
Heparin Unfractionated: 0.37 IU/mL (ref 0.30–0.70)

## 2020-02-16 LAB — HEMOGLOBIN A1C
Hgb A1c MFr Bld: 12.1 % — ABNORMAL HIGH (ref 4.8–5.6)
Mean Plasma Glucose: 300.57 mg/dL

## 2020-02-16 LAB — ECHOCARDIOGRAM COMPLETE
Height: 67 in
Weight: 3840 oz

## 2020-02-16 LAB — GLUCOSE, CAPILLARY
Glucose-Capillary: 286 mg/dL — ABNORMAL HIGH (ref 70–99)
Glucose-Capillary: 299 mg/dL — ABNORMAL HIGH (ref 70–99)
Glucose-Capillary: 345 mg/dL — ABNORMAL HIGH (ref 70–99)
Glucose-Capillary: 270 mg/dL — ABNORMAL HIGH (ref 70–99)

## 2020-02-16 LAB — TROPONIN I (HIGH SENSITIVITY): Troponin I (High Sensitivity): 17 ng/L (ref <18)

## 2020-02-16 LAB — PROTIME-INR
INR: 1.2 (ref 0.8–1.2)
Prothrombin Time: 14.8 seconds (ref 11.4–15.2)

## 2020-02-16 LAB — TSH: TSH: 0.623 u[IU]/mL (ref 0.350–4.500)

## 2020-02-16 LAB — LACTIC ACID, PLASMA: Lactic Acid, Venous: 2.2 mmol/L (ref 0.5–1.9)

## 2020-02-16 MED ORDER — INSULIN ASPART 100 UNIT/ML ~~LOC~~ SOLN
6.0000 [IU] | Freq: Three times a day (TID) | SUBCUTANEOUS | Status: DC
  Administered 2020-02-16 – 2020-02-17 (×4): 6 [IU] via SUBCUTANEOUS

## 2020-02-16 MED ORDER — INSULIN ASPART 100 UNIT/ML ~~LOC~~ SOLN
0.0000 [IU] | Freq: Every day | SUBCUTANEOUS | Status: DC
  Administered 2020-02-16: 3 [IU] via SUBCUTANEOUS

## 2020-02-16 MED ORDER — INSULIN ASPART 100 UNIT/ML ~~LOC~~ SOLN
0.0000 [IU] | Freq: Three times a day (TID) | SUBCUTANEOUS | Status: DC
  Administered 2020-02-16 (×2): 11 [IU] via SUBCUTANEOUS
  Administered 2020-02-16: 15 [IU] via SUBCUTANEOUS
  Administered 2020-02-17: 7 [IU] via SUBCUTANEOUS

## 2020-02-16 MED ORDER — DIPHENHYDRAMINE HCL 25 MG PO CAPS
25.0000 mg | ORAL_CAPSULE | Freq: Every evening | ORAL | Status: DC | PRN
  Administered 2020-02-17: 25 mg via ORAL
  Filled 2020-02-16: qty 1

## 2020-02-16 MED ORDER — AMLODIPINE BESYLATE 5 MG PO TABS
5.0000 mg | ORAL_TABLET | Freq: Every day | ORAL | Status: DC
  Administered 2020-02-16 – 2020-02-17 (×2): 5 mg via ORAL
  Filled 2020-02-16 (×3): qty 1

## 2020-02-16 MED ORDER — HEPARIN (PORCINE) 25000 UT/250ML-% IV SOLN: 1100.0000 [IU]/h | INTRAVENOUS | Status: DC

## 2020-02-16 MED ORDER — GUAIFENESIN-DM 100-10 MG/5ML PO SYRP: 5.0000 mL | ORAL_SOLUTION | ORAL | Status: DC | PRN

## 2020-02-16 MED ORDER — HEPARIN (PORCINE) 25000 UT/250ML-% IV SOLN
1200.0000 [IU]/h | INTRAVENOUS | Status: DC
  Administered 2020-02-16 (×2): 1200 [IU]/h via INTRAVENOUS
  Filled 2020-02-16: qty 250

## 2020-02-16 MED ORDER — MELATONIN 5 MG PO TABS: 10.0000 mg | ORAL_TABLET | Freq: Every evening | ORAL | Status: DC | PRN

## 2020-02-16 MED ORDER — HYDROCOD POLST-CPM POLST ER 10-8 MG/5ML PO SUER: 5.0000 mL | Freq: Two times a day (BID) | ORAL | Status: DC | PRN

## 2020-02-16 NOTE — Care Management (Signed)
Per [facility] M. W[facility] :  Co-pay amount for Xarelto 20 mg daily for a 30 day supply $9.20.  No PA required No Deductible Tier ? Retail [facility]  Pt. Has : LIS

## 2020-02-16 NOTE — TOC Progression Note (Signed)
Transition of Care [facility]) - Progression Note    Patient Details  Name: Sierra Ellis MRN: [id number] Date of Birth: [birth year]  Transition of Care [facility]) CM/SW Contact  Kharson Rasmusson, Olegario Messier, RN Phone Number: 02/16/2020, 1:25 PM  Clinical Narrative: see prior benefit check note for co pay for xarelto $9.20.      Expected Discharge Plan: Home/Self Care Barriers to Discharge: Continued Medical Work up  Expected Discharge Plan and Services Expected Discharge Plan: Home/Self Care   Discharge Planning Services: CM Consult   Living arrangements for the past 2 months: Single Family Home                                       Social Determinants of Health (SDOH) Interventions    Readmission Risk Interventions No flowsheet data found.

## 2020-02-16 NOTE — TOC Initial Note (Signed)
Transition of Care [facility]) - Initial/Assessment Note    Patient Details  Name: Sierra Ellis MRN: [id number] Date of Birth: 09/02/1961  Transition of Care [facility]) CM/SW Contact:    Dessa Phi, RN Phone Number: 02/16/2020, 12:38 PM  Clinical Narrative:  CM referral for benefit check xarelto-await benefit check results.                 Expected Discharge Plan: Home/Self Care Barriers to Discharge: Continued Medical Work up   Patient Goals and CMS Choice Patient states their goals for this hospitalization and ongoing recovery are:: home      Expected Discharge Plan and Services Expected Discharge Plan: Home/Self Care   Discharge Planning Services: CM Consult   Living arrangements for the past 2 months: Single Family Home                                      Prior Living Arrangements/Services Living arrangements for the past 2 months: Single Family Home Lives with:: Self Patient language and need for interpreter reviewed:: Yes Do you feel safe going back to the place where you live?: Yes      Need for Family Participation in Patient Care: No (Comment) Care giver support system in place?: Yes (comment)   Criminal Activity/Legal Involvement Pertinent to Current Situation/Hospitalization: No - Comment as needed  Activities of Daily Living Home Assistive Devices/Equipment: None ADL Screening (condition at time of admission) Patient's cognitive ability adequate to safely complete daily activities?: Yes Is the patient deaf or have difficulty hearing?: No Does the patient have difficulty seeing, even when wearing glasses/contacts?: No Does the patient have difficulty concentrating, remembering, or making decisions?: No Patient able to express need for assistance with ADLs?: Yes Does the patient have difficulty dressing or bathing?: No Independently performs ADLs?: Yes (appropriate for developmental age) Does the patient have difficulty walking or climbing stairs?:  No Weakness of Legs: None Weakness of Arms/Hands: None  Permission Sought/Granted Permission sought to share information with : Case Manager Permission granted to share information with : Yes, Verbal Permission Granted              Emotional Assessment Appearance:: Appears stated age Attitude/Demeanor/Rapport: Gracious Affect (typically observed): Accepting Orientation: : Oriented to Self, Oriented to Place, Oriented to  Time, Oriented to Situation Alcohol / Substance Use: Not Applicable    Admission diagnosis:  Acute saddle pulmonary embolism without acute cor pulmonale (HCC) [I26.92] Acute saddle pulmonary embolism, unspecified whether acute cor pulmonale present [facility]) [I26.92] Patient Active Problem List   Diagnosis Date Noted  . Acute saddle pulmonary embolism without acute cor pulmonale ([place]) 02/15/2020  . Severe obesity (BMI >= 40) ([place]) 01/24/2020  . Acute respiratory failure with hypoxia ([place]) 01/24/2020  . Hypokalemia 01/21/2020  . Coronary artery disease involving native coronary artery of native heart without angina pectoris 01/21/2020  . OSA (obstructive sleep apnea) 01/21/2020  . Former smoker 01/02/2020  . History of seizure 01/02/2020  . Gastroesophageal reflux disease without esophagitis 01/02/2020  . Carpal tunnel syndrome 11/25/2016  . Peripheral neuropathy 01/30/2016  . 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 Diabetes mellitus type II, uncontrolled ([place]) 01/17/2012  . Essential hypertension 01/17/2012  . Mixed diabetic hyperlipidemia associated with type 2 diabetes mellitus ([place]) 01/17/2012  . GERD (gastroesophageal reflux disease) 01/17/2012  . Abdominal pain 01/17/2012  . 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/17/2012  .  Diverticulosis 12/02/2011  . 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  . Bariatric surgery status 02/11/2010   PCP:  Verlon Au, MD Pharmacy:   [facility] [phone] - [place], [place] - [address] AT [facility] [address] & [facility] [address] [place] [address] Phone: [phone] Fax: [phone]  [id number] - [place], [place] - [address] AT [facility] OF [address] & [facility] [address] [place] [address] Phone: [phone] Fax: [phone]  [facility] [address]  - [address] [place] [address] Phone: [phone] Fax: [phone]     Social Determinants of Health (SDOH) Interventions    Readmission Risk Interventions No flowsheet data found.

## 2020-02-16 NOTE — Progress Notes (Signed)
PROGRESS NOTE    Sierra Ellis  RJJ:[id number] DOB: 1960/12/31 DOA: 02/15/2020 PCP: Bartholome Bill, MD      Brief Narrative:  Sierra Ellis is a 59 y.o. F with CAD, DM, HTN, obesity s/p gastric bypass, and recent COVID pneumonia who presented with 1 week gradually progressive shortness of breath, weakness, and dry cough.  In the ER, she required 2L O2 to keep O2 sat>88%. CTA chest showed bilateral pulmonary emboli without right heart strain.  She was started on heparin infusion and admitted to the hospitalist service.           Assessment & Plan:  Acute saddle pulmonary embolism without right heart strain PESI 97 due to age, HR 130 at admission and SpO2 <90%.  This is an intermediate risk PE.    HR improved overnight, no BP instability. -Continue heparin gtt -Obtain Echo -Wean O2 as able    Hypertension Blood pressure elevated -Continue furosemide, diltiazem -Resume amlodipine  Diabetes Hemoglobin A1c 12.1%.  Glucose extremely high overnight (after Solu-Medrol was given empirically in the ER). -Continue atorvastatin -Continue Lantus -Continue SS insulins, increase dose           Disposition: The patient was admitted with acute saddle pulmonary embolism.    The patient is hemodynamically stable, and asymptomatic other than a cough.  If her echocardiogram shows no evidence of heart strain or systolic dysfunction, if she remains relatively asymptomatic and without hypoxia, will transition to oral anticoagulant and discharge tomorrow.       MDM: The below labs and imaging reports were reviewed and summarized above.  Medication management as above.  This is a severe and life-threatening illness.    DVT prophylaxis: N/A, has PE on heparin infusion Code Status: FULL Family Communication: Family member on the video call while I was in the room, attempted calls to both daughters by phone after, no answer.    Consultants:     Procedures:    3/26 echocardiogram pending  Antimicrobials:      Culture data:              Subjective: Patient has a cough.  She has no chest pain, dyspnea at rest.  No hemoptysis.  No confusion.  Objective: Vitals:   02/16/20 0600 02/16/20 0800 02/16/20 0900 02/16/20 1100  BP: (!) 177/97 (!) 176/83 (!) 174/83   Pulse: 92 91 86 97  Resp: 20 (!) 22 17 (!) 21  Temp:  98.1 F (36.7 C)    TempSrc:  Oral    SpO2: 98% 95% 94% 94%  Weight:      Height:        Intake/Output Summary (Last 24 hours) at 02/16/2020 1203 Last data filed at 02/16/2020 0160 Gross per 24 hour  Intake 1136.32 ml  Output --  Net 1136.32 ml   Filed Weights   02/15/20 1540  Weight: 108.9 kg    Examination: General appearance: Well-nourished adult female, alert and in no acute distress.  Lying in bed HEENT: Anicteric, conjunctiva pink, lids and lashes normal. No nasal deformity, discharge, epistaxis.  Lips moist oropharynx moist, no oral lesions, dentition normal, hearing normal.   Skin: Warm and dry.   No suspicious rashes or lesions. Cardiac: RRR, nl S1-S2, no murmurs appreciated.  Capillary refill is brisk.  JVP not visible. No LE edema.  Radial pulses 2+ and symmetric. Respiratory: Normal respiratory rate and rhythm.  CTAB without rales or wheezes. Abdomen: Abdomen soft.  No TTP or guarding. No ascites, distension, hepatosplenomegaly.  MSK: No deformities or effusions. Neuro: Awake and alert.  EOMI, moves all extremities. Speech fluent.    Psych: Sensorium intact and responding to questions, attention normal. Affect normal.  Judgment and insight appear normal.    Data Reviewed: I have personally reviewed following labs and imaging studies:  CBC: Recent Labs  Lab 02/15/20 1601 02/16/20 0242  WBC 6.6 7.9  NEUTROABS 4.6 6.7  HGB 13.5 12.3  HCT 43.4 39.4  MCV 88.9 89.1  PLT 193 187   Basic Metabolic Panel: Recent Labs  Lab 02/15/20 1601 02/16/20 0242  NA 140 138  K 3.7 3.8  CL 102 101   CO2 27 25  GLUCOSE 309* 404*  BUN 5* 8  CREATININE 0.58 0.59  CALCIUM 8.8* 8.6*   GFR: Estimated Creatinine Clearance: 98.6 mL/min (by C-G formula based on SCr of 0.59 mg/dL). Liver Function Tests: Recent Labs  Lab 02/15/20 1601 02/16/20 0242  AST 19 14*  ALT 26 25  ALKPHOS 114 100  BILITOT 0.6 0.7  PROT 7.4 6.9  ALBUMIN 3.6 3.3*   No results for input(s): LIPASE, AMYLASE in the last 168 hours. No results for input(s): AMMONIA in the last 168 hours. Coagulation Profile: Recent Labs  Lab 02/16/20 0242  INR 1.2   Cardiac Enzymes: No results for input(s): CKTOTAL, CKMB, CKMBINDEX, TROPONINI in the last 168 hours. BNP (last 3 results) No results for input(s): PROBNP in the last 8760 hours. HbA1C: Recent Labs    02/16/20 0242  HGBA1C 12.1*   CBG: Recent Labs  Lab 02/15/20 2237 02/16/20 0736  GLUCAP 374* 286*   Lipid Profile: No results for input(s): CHOL, HDL, LDLCALC, TRIG, CHOLHDL, LDLDIRECT in the last 72 hours. Thyroid Function Tests: Recent Labs    02/16/20 0242  TSH 0.623   Anemia Panel: No results for input(s): VITAMINB12, FOLATE, FERRITIN, TIBC, IRON, RETICCTPCT in the last 72 hours. Urine analysis:    Component Value Date/Time   COLORURINE YELLOW 07/20/2019 1003   APPEARANCEUR CLEAR 07/20/2019 1003   LABSPEC 1.037 (H) 07/20/2019 1003   PHURINE 5.0 07/20/2019 1003   GLUCOSEU >=500 (A) 07/20/2019 1003   HGBUR NEGATIVE 07/20/2019 1003   BILIRUBINUR NEGATIVE 07/20/2019 1003   KETONESUR 20 (A) 07/20/2019 1003   PROTEINUR NEGATIVE 07/20/2019 1003   UROBILINOGEN 1.0 01/17/2012 1641   NITRITE NEGATIVE 07/20/2019 1003   LEUKOCYTESUR MODERATE (A) 07/20/2019 1003   Sepsis Labs: @LABRCNTIP (procalcitonin:4,lacticacidven:4)  ) Recent Results (from the past 240 hour(s))  MRSA PCR Screening     Status: None   Collection Time: 02/15/20 10:16 PM   Specimen: Nasal Mucosa; Nasopharyngeal  Result Value Ref Range Status   MRSA by PCR NEGATIVE NEGATIVE  Final    Comment:        The GeneXpert MRSA Assay (FDA approved for NASAL specimens only), is one component of a comprehensive MRSA colonization surveillance program. It is not intended to diagnose MRSA infection nor to guide or monitor treatment for MRSA infections. Performed at [facility], [address], [place], [place]          Radiology Studies: CT Angio Chest PE W and/or Wo Contrast  Result Date: 02/15/2020 CLINICAL DATA:  Shortness of breath, tested COVID-19 positive through weeks ago, recent hospital admission and discharge, short of breath for 2 days, history hypertension, diabetes mellitus EXAM: CT ANGIOGRAPHY CHEST WITH CONTRAST TECHNIQUE: Multidetector CT imaging of the chest was performed using the standard protocol during bolus administration of intravenous contrast. Multiplanar CT image reconstructions  and MIPs were obtained to evaluate the vascular anatomy. CONTRAST:  OMNIPAQUE IOHEXOL 350 MG/ML SOLN IV COMPARISON:  None FINDINGS: Cardiovascular: Atherosclerotic calcifications aorta. No aortic aneurysm or dissection. No pericardial effusion. Pulmonary arteries adequately opacified. Filling defects are seen within pulmonary arteries bilaterally consistent with pulmonary embolism. These include a saddle embolus at the bifurcation of LEFT pulmonary artery extending into upper and lower lobe. Additional BILATERAL lower lobe pulmonary emboli. Normal RV/LV ratio = 0.75 Mediastinum/Nodes: Base of cervical region normal appearance. Few normal size mediastinal lymph nodes. No thoracic adenopathy. Esophagus unremarkable. Lungs/Pleura: Respiratory motion artifacts at lower lungs. Linear subsegmental atelectasis at lingula and posterior RIGHT upper lobe. Minimal nonspecific mosaic attenuation without definite pulmonary infiltrate. No pleural effusion or pneumothorax. Upper Abdomen: Unremarkable Musculoskeletal: Unremarkable Review of the MIP images  confirms the above findings. IMPRESSION: BILATERAL pulmonary emboli including a saddle embolus at the bifurcation of LEFT pulmonary artery as well as BILATERAL lower lobe pulmonary emboli. Normal RV/LV ratio = 0.75 Aortic Atherosclerosis (ICD10-I70.0). Critical Value/emergent results were called by telephone at the time of interpretation on 02/15/2020 at 6:35 pm to provider JOSEPH ZAMMIT , who verbally acknowledged these results. Electronically Signed   By: Ulyses Southward M.D.   On: 02/15/2020 18:38        Scheduled Meds:  amLODipine  5 mg Oral Daily   atorvastatin  20 mg Oral Daily   Chlorhexidine Gluconate Cloth  6 each Topical Daily   diltiazem  120 mg Oral Daily   furosemide  20 mg Oral Daily   insulin aspart  0-20 Units Subcutaneous TID WC   insulin aspart  0-5 Units Subcutaneous QHS   insulin aspart  6 Units Subcutaneous TID WC   insulin glargine  30 Units Subcutaneous Daily   mouth rinse  15 mL Mouth Rinse BID   Continuous Infusions:  heparin 1,100 Units/hr (02/16/20 1130)     LOS: 1 day    Time spent: 35 minutes    Alberteen Sam, MD [facility] 02/16/2020, 12:03 PM     Please page though AMION or Epic secure chat:  For [employer], Higher education careers adviser

## 2020-02-16 NOTE — Progress Notes (Signed)
ANTICOAGULATION CONSULT NOTE  Pharmacy Consult for heparin Indication: pulmonary embolus  Allergies  Allergen Reactions  . Aspirin Other (See Comments)    Seizures.   . Demerol Other (See Comments)    Seizures.   . Lisinopril Cough  . Losartan     Sneezing and coughing  . Morphine Other (See Comments)    Seizures  . Oxycodone Other (See Comments)    seizures  . Penicillins Other (See Comments)    Seizures.Has patient had a PCN reaction causing immediate rash, facial/tongue/throat swelling, SOB or lightheadedness with hypotension: Yes Has patient had a PCN reaction causing severe rash involving mucus membranes or skin necrosis: Yes Has patient had a PCN reaction that required hospitalizationYes Has patient had a PCN reaction occurring within the last 10 years: Yes If all of the above answers are "NO", then may proceed with Cephalosporin use.   Marland Kitchen Phentermine     History of seizure disorder  . Trazodone Other (See Comments)    Hallucinations    Patient Measurements: Height: 5\' 7"  (170.2 cm) Weight: 240 lb (108.9 kg) IBW/kg (Calculated) : 61.6 Heparin Dosing Weight: 87 kg  Vital Signs: Temp: 98.4 F (36.9 C) (03/26 1758) Temp Source: Oral (03/26 1758) BP: 159/88 (03/26 1758) Pulse Rate: 105 (03/26 1758)  Labs: Recent Labs    02/15/20 1601 02/15/20 1921 02/15/20 2225 02/16/20 0242 02/16/20 1005 02/16/20 1714  HGB 13.5  --   --  12.3  --   --   HCT 43.4  --   --  39.4  --   --   PLT 193  --   --  187  --   --   LABPROT  --   --   --  14.8  --   --   INR  --   --   --  1.2  --   --   HEPARINUNFRC  --   --   --  0.97* 0.79* 0.37  CREATININE 0.58  --   --  0.59  --   --   TROPONINIHS  --  17 19* 17  --   --     Estimated Creatinine Clearance: 98.6 mL/min (by C-G formula based on SCr of 0.59 mg/dL).   Assessment: 20 yoF presents with SOB. Pt was recently hospitalized with COVID PNA in Feb 2021. CT angio (3/25): + for bilateral PE including saddle embolus. IV  heparin initiated, pharmacy to dose. Pt not on anticoagulants PTA.  Today, 02/16/20  Most recent heparin level now therapeutic after rate reduction x 2  Confirmed with RN that heparin infusing at correct rate. No signs of bleeding or bruising.  CBC: WNL & stable  SCr stable at baseline  Goal of Therapy:  Heparin level 0.3-0.7 units/ml Monitor platelets by anticoagulation protocol   Plan:   Continue IV heparin at 1100 units/hr  Recheck heparin level in 6 hours  Daily CBC, HL  Monitor for signs/symptoms of bleeding  Joven Mom A, PharmD 02/16/20 7:30 PM

## 2020-02-16 NOTE — Progress Notes (Signed)
  Echocardiogram 2D Echocardiogram has been performed.  Sierra Ellis 02/16/2020, 9:48 AM

## 2020-02-16 NOTE — Progress Notes (Signed)
ANTICOAGULATION CONSULT NOTE - follow up  Pharmacy Consult for heparin Indication: pulmonary embolus  Allergies  Allergen Reactions  . Aspirin Other (See Comments)    Seizures.   . Demerol Other (See Comments)    Seizures.   . Lisinopril Cough  . Losartan     Sneezing and coughing  . Morphine Other (See Comments)    Seizures  . Oxycodone Other (See Comments)    seizures  . Penicillins Other (See Comments)    Seizures.Has patient had a PCN reaction causing immediate rash, facial/tongue/throat swelling, SOB or lightheadedness with hypotension: Yes Has patient had a PCN reaction causing severe rash involving mucus membranes or skin necrosis: Yes Has patient had a PCN reaction that required hospitalizationYes Has patient had a PCN reaction occurring within the last 10 years: Yes If all of the above answers are "NO", then may proceed with Cephalosporin use.   Marland Kitchen Phentermine     History of seizure disorder  . Trazodone Other (See Comments)    Hallucinations    Patient Measurements: Height: 5\' 7"  (170.2 cm) Weight: 240 lb (108.9 kg) IBW/kg (Calculated) : 61.6 Heparin Dosing Weight: 100kg  Vital Signs: Temp: 98.7 F (37.1 C) (03/25 2320) Temp Source: Oral (03/25 2320) BP: 146/83 (03/26 0200) Pulse Rate: 98 (03/26 0200)  Labs: Recent Labs    02/15/20 1601 02/15/20 1921 02/15/20 2225 02/16/20 0242  HGB 13.5  --   --  12.3  HCT 43.4  --   --  39.4  PLT 193  --   --  187  LABPROT  --   --   --  14.8  INR  --   --   --  1.2  HEPARINUNFRC  --   --   --  0.97*  CREATININE 0.58  --   --   --   TROPONINIHS  --  17 19*  --     Estimated Creatinine Clearance: 98.6 mL/min (by C-G formula based on SCr of 0.58 mg/dL).   Medical History: Past Medical History:  Diagnosis Date  . Abnormal Pap smear of cervix 2011   S/P "freezing it all out" likely colposcopy with cryoablation  . Coronary artery disease involving native coronary artery of native heart without angina pectoris  01/21/2020  . Diabetes mellitus   . Essential hypertension 01/17/2012  . Pneumonia due to COVID-19 virus 01/20/2020  . Seizures (HCC)   . 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/17/2012  . 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     Assessment:  59 y.o. female presents with shortness of breath.  Patient was treated for Covid in the hospital a month ago. No prior AC, pharmacy to dose heparin for PE. Baseline CBC and SCr WNL   Heparin level SUPRAtherapeutic at 0242 = 0.97 on current IV heparin rate of 1700 units/hr  Per RN, no problems with IV site and no bleeding noted  Goal of Therapy:  Heparin level 0.3-0.7 units/ml Monitor platelets by anticoagulation protocol   Plan:   Reduce IV heparin from 1700 units/hr to 1200 units/hr  Recheck heparin level in 6 hours  Daily CBC  59, PharmD, BCPS 02/16/2020 3:32 AM

## 2020-02-16 NOTE — Progress Notes (Signed)
ANTICOAGULATION CONSULT NOTE - follow up  Pharmacy Consult for heparin Indication: pulmonary embolus  Allergies  Allergen Reactions  . Aspirin Other (See Comments)    Seizures.   . Demerol Other (See Comments)    Seizures.   . Lisinopril Cough  . Losartan     Sneezing and coughing  . Morphine Other (See Comments)    Seizures  . Oxycodone Other (See Comments)    seizures  . Penicillins Other (See Comments)    Seizures.Has patient had a PCN reaction causing immediate rash, facial/tongue/throat swelling, SOB or lightheadedness with hypotension: Yes Has patient had a PCN reaction causing severe rash involving mucus membranes or skin necrosis: Yes Has patient had a PCN reaction that required hospitalizationYes Has patient had a PCN reaction occurring within the last 10 years: Yes If all of the above answers are "NO", then may proceed with Cephalosporin use.   Marland Kitchen Phentermine     History of seizure disorder  . Trazodone Other (See Comments)    Hallucinations    Patient Measurements: Height: 5\' 7"  (170.2 cm) Weight: 240 lb (108.9 kg) IBW/kg (Calculated) : 61.6 Heparin Dosing Weight: 87 kg  Vital Signs: Temp: 98.1 F (36.7 C) (03/26 0800) Temp Source: Oral (03/26 0800) BP: 174/83 (03/26 0900) Pulse Rate: 86 (03/26 0900)  Labs: Recent Labs    02/15/20 1601 02/15/20 1921 02/15/20 2225 02/16/20 0242 02/16/20 1005  HGB 13.5  --   --  12.3  --   HCT 43.4  --   --  39.4  --   PLT 193  --   --  187  --   LABPROT  --   --   --  14.8  --   INR  --   --   --  1.2  --   HEPARINUNFRC  --   --   --  0.97* 0.79*  CREATININE 0.58  --   --  0.59  --   TROPONINIHS  --  17 19* 17  --     Estimated Creatinine Clearance: 98.6 mL/min (by C-G formula based on SCr of 0.59 mg/dL).   Medical History: Past Medical History:  Diagnosis Date  . Abnormal Pap smear of cervix 2011   S/P "freezing it all out" likely colposcopy with cryoablation  . Coronary artery disease involving native  coronary artery of native heart without angina pectoris 01/21/2020  . Diabetes mellitus   . Essential hypertension 01/17/2012  . Pneumonia due to COVID-19 virus 01/20/2020  . Seizures (HCC)   . 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/17/2012  . 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    Assessment: 71 yoF presents with SOB. Pt was recently hospitalized with COVID PNA in Feb 2021. CT angio (3/25): + for bilateral PE including saddle embolus. IV heparin initiated, pharmacy to dose. Pt not on anticoagulants PTA.  Today, 02/16/20  HL = 0.79 remains supratherapeutic despite rate decrease to 1200 units/hr  Confirmed with RN that heparin infusing at correct rate. No signs of bleeding or bruising.  CBC: WNL & stable  Goal of Therapy:  Heparin level 0.3-0.7 units/ml Monitor platelets by anticoagulation protocol   Plan:   Decrease heparin infusion to 1100 units/hr  Recheck heparin level in 6 hours  Daily CBC, HL  Monitor for signs/symptoms of bleeding  02/18/20, PharmD 02/16/20 11:24 AM

## 2020-02-17 LAB — CBC
HCT: 38.1 % (ref 36.0–46.0)
Hemoglobin: 12.1 g/dL (ref 12.0–15.0)
MCH: 28.2 pg (ref 26.0–34.0)
MCHC: 31.8 g/dL (ref 30.0–36.0)
MCV: 88.8 fL (ref 80.0–100.0)
Platelets: 254 10*3/uL (ref 150–400)
RBC: 4.29 MIL/uL (ref 3.87–5.11)
RDW: 14.3 % (ref 11.5–15.5)
WBC: 10.2 10*3/uL (ref 4.0–10.5)
nRBC: 0 % (ref 0.0–0.2)

## 2020-02-17 LAB — BASIC METABOLIC PANEL
Anion gap: 11 (ref 5–15)
BUN: 12 mg/dL (ref 6–20)
CO2: 28 mmol/L (ref 22–32)
Calcium: 9 mg/dL (ref 8.9–10.3)
Chloride: 102 mmol/L (ref 98–111)
Creatinine, Ser: 0.63 mg/dL (ref 0.44–1.00)
GFR calc Af Amer: >60 mL/min (ref >60)
GFR calc non Af Amer: >60 mL/min (ref >60)
Glucose, Bld: 228 mg/dL — ABNORMAL HIGH (ref 70–99)
Potassium: 4 mmol/L (ref 3.5–5.1)
Sodium: 141 mmol/L (ref 135–145)

## 2020-02-17 LAB — HEPARIN LEVEL (UNFRACTIONATED)
Heparin Unfractionated: 0.24 IU/mL — ABNORMAL LOW (ref 0.30–0.70)
Heparin Unfractionated: 0.44 IU/mL (ref 0.30–0.70)

## 2020-02-17 LAB — GLUCOSE, CAPILLARY
Glucose-Capillary: 202 mg/dL — ABNORMAL HIGH (ref 70–99)
Glucose-Capillary: 244 mg/dL — ABNORMAL HIGH (ref 70–99)

## 2020-02-17 MED ORDER — HYDROCORTISONE ACETATE 25 MG RE SUPP: 25.0000 mg | Freq: Two times a day (BID) | RECTAL | 3 refills | Status: AC | PRN

## 2020-02-17 MED ORDER — RIVAROXABAN 20 MG PO TABS: 20.0000 mg | ORAL_TABLET | Freq: Every day | ORAL | Status: DC

## 2020-02-17 MED ORDER — LANTUS SOLOSTAR 100 UNIT/ML ~~LOC~~ SOPN: 30.0000 [IU] | PEN_INJECTOR | Freq: Every day | SUBCUTANEOUS | 11 refills | Status: AC

## 2020-02-17 MED ORDER — RIVAROXABAN 15 MG PO TABS
15.0000 mg | ORAL_TABLET | Freq: Two times a day (BID) | ORAL | Status: DC
  Administered 2020-02-17: 15 mg via ORAL
  Filled 2020-02-17: qty 1

## 2020-02-17 MED ORDER — POLYETHYLENE GLYCOL 3350 17 G PO PACK: 17.0000 g | PACK | Freq: Every day | ORAL | 0 refills | Status: AC | PRN

## 2020-02-17 MED ORDER — HEPARIN (PORCINE) 25000 UT/250ML-% IV SOLN
1200.0000 [IU]/h | INTRAVENOUS | Status: AC
  Administered 2020-02-17: 1200 [IU]/h via INTRAVENOUS
  Filled 2020-02-17: qty 250

## 2020-02-17 MED ORDER — RIVAROXABAN 20 MG PO TABS: 20.0000 mg | ORAL_TABLET | Freq: Every day | ORAL | 5 refills | Status: AC

## 2020-02-17 MED ORDER — HYDROCOD POLST-CPM POLST ER 10-8 MG/5ML PO SUER: 5.0000 mL | Freq: Two times a day (BID) | ORAL | 0 refills | Status: AC | PRN

## 2020-02-17 MED ORDER — RIVAROXABAN (XARELTO) VTE STARTER PACK (15 & 20 MG): ORAL_TABLET | ORAL | 0 refills | Status: AC

## 2020-02-17 NOTE — Discharge Summary (Signed)
Physician Discharge Summary   Patient ID: Olena MaterJanet Leverett MRN: [id number] DOB/AGE: [birth year] 59 y.o.  Admit date: 02/15/2020 Discharge date: 02/17/2020  Primary Care Physician:  Verlon AuBoyd, Tammy Lamonica, MD   Recommendations for Outpatient Follow-up:  1. Follow up with PCP in 1-2 weeks 2. Started on Xarelto 15 mg twice a day for for 21 days, then 20 mg daily  Home Health: None Equipment/Devices:   Discharge Condition: stable  CODE STATUS: FULL  Diet recommendation:    Discharge Diagnoses:   . Acute saddle pulmonary embolism without acute cor pulmonale (HCC) . Essential hypertension . Diabetes mellitus type II, uncontrolled (HCC) . Recent COVID-19 pneumonia .  type 2 diabetes mellitus (HCC)   Consults: None    Allergies:   Allergies  Allergen Reactions  . Aspirin Other (See Comments)    Seizures.   . Demerol Other (See Comments)    Seizures.   . Lisinopril Cough  . Losartan     Sneezing and coughing  . Morphine Other (See Comments)    Seizures  . Oxycodone Other (See Comments)    seizures  . Penicillins Other (See Comments)    Seizures.Has patient had a PCN reaction causing immediate rash, facial/tongue/throat swelling, SOB or lightheadedness with hypotension: Yes Has patient had a PCN reaction causing severe rash involving mucus membranes or skin necrosis: Yes Has patient had a PCN reaction that required hospitalizationYes Has patient had a PCN reaction occurring within the last 10 years: Yes If all of the above answers are "NO", then may proceed with Cephalosporin use.   Marland Kitchen. Phentermine     History of seizure disorder  . Trazodone Other (See Comments)    Hallucinations     DISCHARGE MEDICATIONS: Allergies as of 02/17/2020      Reactions   Aspirin Other (See Comments)   Seizures.    Demerol Other (See Comments)   Seizures.    Lisinopril Cough   Losartan    Sneezing and coughing   Morphine Other (See Comments)   Seizures   Oxycodone Other (See  Comments)   seizures   Penicillins Other (See Comments)   Seizures.Has patient had a PCN reaction causing immediate rash, facial/tongue/throat swelling, SOB or lightheadedness with hypotension: Yes Has patient had a PCN reaction causing severe rash involving mucus membranes or skin necrosis: Yes Has patient had a PCN reaction that required hospitalizationYes Has patient had a PCN reaction occurring within the last 10 years: Yes If all of the above answers are "NO", then may proceed with Cephalosporin use.   Phentermine    History of seizure disorder   Trazodone Other (See Comments)   Hallucinations      Medication List    TAKE these medications   albuterol 108 (90 Base) MCG/ACT inhaler Commonly known as: VENTOLIN HFA Inhale 2 puffs into the lungs every 6 (six) hours as needed for wheezing or shortness of breath.   amLODipine 5 MG tablet Commonly known as: NORVASC Take 5 mg by mouth daily.   atorvastatin 20 MG tablet Commonly known as: LIPITOR Take 20 mg by mouth daily.   chlorpheniramine-HYDROcodone 10-8 MG/5ML Suer Commonly known as: TUSSIONEX Take 5 mLs by mouth every 12 (twelve) hours as needed (severe cough).   cholecalciferol 25 MCG (1000 UNIT) tablet Commonly known as: VITAMIN D3 Take 1,000 Units by mouth daily.   DELSYM PO Take 1 tablet by mouth daily as needed (cough).   diltiazem 120 MG 24 hr capsule Commonly known as: CARDIZEM CD Take 120 mg by  mouth daily.   furosemide 20 MG tablet Commonly known as: LASIX Take 20 mg by mouth daily.   guaiFENesin-dextromethorphan 100-10 MG/5ML syrup Commonly known as: ROBITUSSIN DM Take 10 mLs by mouth every 4 (four) hours as needed for cough.   hydrocortisone 25 MG suppository Commonly known as: ANUSOL-HC Place 1 suppository (25 mg total) rectally 2 (two) times daily as needed for hemorrhoids or anal itching.   Klor-Con M20 20 MEQ tablet Generic drug: potassium chloride SA Take 20 mEq by mouth daily.   Lantus  SoloStar 100 UNIT/ML Solostar Pen Generic drug: insulin glargine Inject 30 Units into the skin at bedtime. What changed:   how much to take  how to take this  when to take this  additional instructions   meclizine 25 MG tablet Commonly known as: ANTIVERT Take 1 tablet (25 mg total) by mouth 3 (three) times daily as needed for dizziness.   metFORMIN 500 MG tablet Commonly known as: GLUCOPHAGE Take 500-1,500 mg by mouth See admin instructions.  in AM and  qhs   polyethylene glycol 17 g packet Commonly known as: MIRALAX / GLYCOLAX Take 17 g by mouth daily as needed for mild constipation (Also available over-the-counter).   Rivaroxaban 15 & 20 MG Tbpk Follow package directions: Take one  tablet by mouth twice a day. On day 22 (03/09/2020) , switch to one  tablet once a day. Take with food.   rivaroxaban 20 MG Tabs tablet Commonly known as: XARELTO Take 1 tablet (20 mg total) by mouth daily with breakfast. Please start taking after you have completed the starter pack Start taking on: March 09, 2020   vitamin C 250 MG tablet Commonly known as: ASCORBIC ACID Take 250 mg by mouth daily.   zinc sulfate 220 (50 Zn) MG capsule Take 1 capsule (220 mg total) by mouth daily.        Brief H and P: For complete details please refer to admission H and P, but in brief Mrs. Crocker is a 59 y.o. F with CAD, DM, HTN, obesity s/p gastric bypass, and recent COVID pneumonia who presented with 1 week gradually progressive shortness of breath, weakness, and dry cough.  In the ER, she required 2L O2 to keep O2 sat>88%. CTA chest showed bilateral pulmonary emboli without right heart strain.  She was started on heparin infusion and admitted to the hospitalist service  Hospital Course:  Acute saddle pulmonary embolism without right heart strain PESI 97 due to age, HR 130 at admission and SpO2 <90%.  -Patient was briefly admitted to stepdown unit due to tachycardia and  hypoxia at the time of admission.  She was placed on IV heparin drip.  She has tolerated IV heparin drip and now transitioned to Xarelto.  She received 30-day free card from pharmacy, Xarelto teaching was provided. 2D echo showed EF of 55 to 60%, grade 1 diastolic dysfunction, right ventricular systolic function is normal -Strongly recommended to follow-up closely with PCP and likely will need 6 to 9 months of acute bilateral saddle PE.   Hypertension BP stable, continue oral antihypertensives  Diabetes Hemoglobin A1c 12.1%.  Increase Lantus to 30 units at bedtime, continue Metformin Patient recommended to continue CBG checks, keep a log and discussed with her PCP for further adjustment   Day of Discharge S: Feels a lot better today, feels back to baseline, shortness of breath is improving, no chest pain.  Hoping to go home today.  BP (!) 147/71 (BP Location: Left Arm)  Pulse 86   Temp (!) 97.5 F (36.4 C) (Oral)   Resp 18   Ht 5\' 7"  (1.702 m)   Wt 108.9 kg   LMP 06/07/2019 (Approximate)   SpO2 95%   BMI 37.59 kg/m   Physical Exam: General: Alert and awake oriented x3 not in any acute distress. HEENT: anicteric sclera, pupils reactive to light and accommodation CVS: S1-S2 clear no murmur rubs or gallops Chest: clear to auscultation bilaterally, no wheezing rales or rhonchi Abdomen: soft nontender, nondistended, normal bowel sounds Extremities: no cyanosis, clubbing or edema noted bilaterally Neuro: Cranial nerves II-XII intact, no focal neurological deficits    Get Medicines reviewed and adjusted: Please take all your medications with you for your next visit with your Primary MD  Please request your Primary MD to go over all hospital tests and procedure/radiological results at the follow up. Please ask your Primary MD to get all Hospital records sent to his/her office.  If you experience worsening of your admission symptoms, develop shortness of breath, life  threatening emergency, suicidal or homicidal thoughts you must seek medical attention immediately by calling 911 or calling your MD immediately  if symptoms less severe.  You must read complete instructions/literature along with all the possible adverse reactions/side effects for all the Medicines you take and that have been prescribed to you. Take any new Medicines after you have completely understood and accept all the possible adverse reactions/side effects.   Do not drive when taking pain medications.   Do not take more than prescribed Pain, Sleep and Anxiety Medications  Special Instructions: If you have smoked or chewed Tobacco  in the last 2 yrs please stop smoking, stop any regular Alcohol  and or any Recreational drug use.  Wear Seat belts while driving.  Please note  You were cared for by a hospitalist during your hospital stay. Once you are discharged, your primary care physician will handle any further medical issues. Please note that NO REFILLS for any discharge medications will be authorized once you are discharged, as it is imperative that you return to your primary care physician (or establish a relationship with a primary care physician if you do not have one) for your aftercare needs so that they can reassess your need for medications and monitor your lab values.   The results of significant diagnostics from this hospitalization (including imaging, microbiology, ancillary and laboratory) are listed below for reference.      Procedures/Studies:  CT Angio Chest PE W and/or Wo Contrast  Result Date: 02/15/2020 CLINICAL DATA:  Shortness of breath, tested COVID-19 positive through weeks ago, recent hospital admission and discharge, short of breath for 2 days, history hypertension, diabetes mellitus EXAM: CT ANGIOGRAPHY CHEST WITH CONTRAST TECHNIQUE: Multidetector CT imaging of the chest was performed using the standard protocol during bolus administration of intravenous  contrast. Multiplanar CT image reconstructions and MIPs were obtained to evaluate the vascular anatomy. CONTRAST:  182mL OMNIPAQUE IOHEXOL 350 MG/ML SOLN IV COMPARISON:  None FINDINGS: Cardiovascular: Atherosclerotic calcifications aorta. No aortic aneurysm or dissection. No pericardial effusion. Pulmonary arteries adequately opacified. Filling defects are seen within pulmonary arteries bilaterally consistent with pulmonary embolism. These include a saddle embolus at the bifurcation of LEFT pulmonary artery extending into upper and lower lobe. Additional BILATERAL lower lobe pulmonary emboli. Normal RV/LV ratio = 0.75 Mediastinum/Nodes: Base of cervical region normal appearance. Few normal size mediastinal lymph nodes. No thoracic adenopathy. Esophagus unremarkable. Lungs/Pleura: Respiratory motion artifacts at lower lungs. Linear subsegmental atelectasis at  lingula and posterior RIGHT upper lobe. Minimal nonspecific mosaic attenuation without definite pulmonary infiltrate. No pleural effusion or pneumothorax. Upper Abdomen: Unremarkable Musculoskeletal: Unremarkable Review of the MIP images confirms the above findings. IMPRESSION: BILATERAL pulmonary emboli including a saddle embolus at the bifurcation of LEFT pulmonary artery as well as BILATERAL lower lobe pulmonary emboli. Normal RV/LV ratio = 0.75 Aortic Atherosclerosis (ICD10-I70.0). Critical Value/emergent results were called by telephone at the time of interpretation on 02/15/2020 at 6:35 pm to provider JOSEPH ZAMMIT , who verbally acknowledged these results. Electronically Signed   By: Ulyses Southward M.D.   On: 02/15/2020 18:38   DG Chest Port 1 View  Result Date: 01/20/2020 CLINICAL DATA:  Left lower lung pain for 1 week, fever EXAM: PORTABLE CHEST 1 VIEW COMPARISON:  07/20/2019 FINDINGS: Cardiomediastinal contours are stable accounting for differences in projection. There is signs of basilar airspace disease at the lung bases bilaterally. No signs of  acute bone finding. IMPRESSION: Signs of basilar airspace disease may represent infection. Study limited by lordotic portable technique. Electronically Signed   By: Donzetta Kohut M.D.   On: 01/20/2020 20:44   ECHOCARDIOGRAM COMPLETE  Result Date: 02/16/2020    ECHOCARDIOGRAM REPORT   Patient Name:   YUKARI FLAX Date of Exam: 02/16/2020 Medical Rec #:  [id number]      Height:       67.0 in Accession #:    [id number]     Weight:       240.0 lb Date of Birth:  1960-12-11      BSA:          2.185 m Patient Age:    58 years       BP:           176/83 mmHg Patient Gender: F              HR:           92 bpm. Exam Location:  Inpatient Procedure: 2D Echo, Cardiac Doppler and Color Doppler Indications:    Pulmonary embolus  History:        Patient has no prior history of Echocardiogram examinations.                 CAD, Obese, Signs/Symptoms:Shortness of Breath; Risk                 Factors:Hypertension and Diabetes. Covid pneumonia.  Sonographer:    Lavenia Atlas Referring Phys: [id number] Marinda Elk  Sonographer Comments: Patient is morbidly obese and Technically difficult study due to poor echo windows. Image acquisition challenging due to patient body habitus. IMPRESSIONS  1. Left ventricular ejection fraction, by estimation, is 55 to 60%. The left ventricle has normal function. The left ventricle has no regional wall motion abnormalities. There is mild concentric left ventricular hypertrophy. Left ventricular diastolic parameters are consistent with Grade I diastolic dysfunction (impaired relaxation).  2. Right ventricular systolic function is normal. The right ventricular size is normal.  3. The mitral valve is grossly normal. Trivial mitral valve regurgitation. No evidence of mitral stenosis.  4. The aortic valve is grossly normal. Aortic valve regurgitation is not visualized. No aortic stenosis is present.  5. The inferior vena cava is dilated in size with <50% respiratory variability, suggesting right  atrial pressure of 15 mmHg. FINDINGS  Left Ventricle: Left ventricular ejection fraction, by estimation, is 55 to 60%. The left ventricle has normal function. The left ventricle has no regional wall motion abnormalities. The left ventricular  internal cavity size was normal in size. There is  mild concentric left ventricular hypertrophy. Left ventricular diastolic parameters are consistent with Grade I diastolic dysfunction (impaired relaxation). Right Ventricle: The right ventricular size is normal. No increase in right ventricular wall thickness. Right ventricular systolic function is normal. Left Atrium: Left atrial size was normal in size. Right Atrium: Right atrial size was normal in size. Pericardium: There is no evidence of pericardial effusion. Presence of pericardial fat pad. Mitral Valve: The mitral valve is grossly normal. Trivial mitral valve regurgitation. No evidence of mitral valve stenosis. Tricuspid Valve: The tricuspid valve is grossly normal. Tricuspid valve regurgitation is not demonstrated. No evidence of tricuspid stenosis. Aortic Valve: The aortic valve is grossly normal. Aortic valve regurgitation is not visualized. No aortic stenosis is present. Pulmonic Valve: The pulmonic valve was grossly normal. Pulmonic valve regurgitation is not visualized. No evidence of pulmonic stenosis. Aorta: The aortic root is normal in size and structure. Venous: The inferior vena cava is dilated in size with less than 50% respiratory variability, suggesting right atrial pressure of 15 mmHg. IAS/Shunts: No atrial level shunt detected by color flow Doppler.  LEFT VENTRICLE PLAX 2D LVIDd:         5.40 cm  Diastology LVIDs:         4.20 cm  LV e' lateral:   7.18 cm/s LV PW:         1.40 cm  LV E/e' lateral: 10.6 LV IVS:        1.30 cm  LV e' medial:    6.74 cm/s LVOT diam:     2.30 cm  LV E/e' medial:  11.3 LV SV:         64 LV SV Index:   29 LVOT Area:     4.15 cm  RIGHT VENTRICLE RV Basal diam:  2.60 cm RV S  prime:     9.36 cm/s TAPSE (M-mode): 2.9 cm LEFT ATRIUM             Index       RIGHT ATRIUM           Index LA diam:        3.50 cm 1.60 cm/m  RA Area:     13.60 cm LA Vol (A2C):   67.1 ml 30.71 ml/m RA Volume:   34.90 ml  15.97 ml/m LA Vol (A4C):   43.2 ml 19.77 ml/m LA Biplane Vol: 55.3 ml 25.31 ml/m  AORTIC VALVE LVOT Vmax:   95.80 cm/s LVOT Vmean:  54.700 cm/s LVOT VTI:    0.155 m  AORTA Ao Root diam: 3.00 cm MITRAL VALVE MV Area (PHT): 4.17 cm    SHUNTS MV Decel Time: 182 msec    Systemic VTI:  0.16 m MV E velocity: 76.00 cm/s  Systemic Diam: 2.30 cm MV A velocity: 90.30 cm/s MV E/A ratio:  0.84 Lennie Odor MD Electronically signed by Lennie Odor MD Signature Date/Time: 02/16/2020/12:50:43 PM    Final       LAB RESULTS: Basic Metabolic Panel: Recent Labs  Lab 02/16/20 0242 02/17/20 0042  NA 138 141  K 3.8 4.0  CL 101 102  CO2 25 28  GLUCOSE 404* 228*  BUN 8 12  CREATININE 0.59 0.63  CALCIUM 8.6* 9.0   Liver Function Tests: Recent Labs  Lab 02/15/20 1601 02/16/20 0242  AST 19 14*  ALT 26 25  ALKPHOS 114 100  BILITOT 0.6 0.7  PROT 7.4 6.9  ALBUMIN 3.6 3.3*  No results for input(s): LIPASE, AMYLASE in the last 168 hours. No results for input(s): AMMONIA in the last 168 hours. CBC: Recent Labs  Lab 02/16/20 0242 02/16/20 0242 02/17/20 0042  WBC 7.9  --  10.2  NEUTROABS 6.7  --   --   HGB 12.3  --  12.1  HCT 39.4  --  38.1  MCV 89.1   < > 88.8  PLT 187  --  254   < > = values in this interval not displayed.   Cardiac Enzymes: No results for input(s): CKTOTAL, CKMB, CKMBINDEX, TROPONINI in the last 168 hours. BNP: Invalid input(s): POCBNP CBG: Recent Labs  Lab 02/17/20 0810 02/17/20 1158  GLUCAP 202* 244*       Disposition and Follow-up: Discharge Instructions    Diet - low sodium heart healthy   Complete by: As directed    Discharge instructions   Complete by: As directed    Follow package directions for Xarelto: Take one 15mg  tablet by  mouth twice a day. On day 22 (03/09/2020) , switch to one 20mg  tablet once a day. Take with food.  Please avoid aspirin, NSAIDs (ibuprofen,Aleve, Motrin) while taking Xarelto  Return to ER if any bleeding issues.   Increase activity slowly   Complete by: As directed        DISPOSITION: Home   DISCHARGE FOLLOW-UP Follow-up Information    03/11/2020, MD. Schedule an appointment as soon as possible for a visit in 2 week(s).   Specialty: Family Medicine Contact information: [address] [place]            Time coordinating discharge:  35 minutes  Signed:   [id number] M.D. [facility] 02/17/2020, 12:07 PM

## 2020-02-17 NOTE — Discharge Instructions (Signed)
Pulmonary Embolism  A pulmonary embolism (PE) is a sudden blockage or decrease of blood flow in one or both lungs. Most blockages come from a blood clot that forms in the vein of a lower leg, thigh, or arm (deep vein thrombosis, DVT) and travels to the lungs. A clot is blood that has thickened into a gel or solid. PE is a dangerous and life-threatening condition that needs to be treated right away. What are the causes? This condition is usually caused by a blood clot that forms in a vein and moves to the lungs. In rare cases, it may be caused by air, fat, part of a tumor, or other tissue that moves through the veins and into the lungs. What increases the risk? The following factors may make you more likely to develop this condition:  Experiencing a traumatic injury, such as breaking a hip or leg.  Having: ? A spinal cord injury. ? Orthopedic surgery, especially hip or knee replacement. ? Any major surgery. ? A stroke. ? DVT. ? Blood clots or blood clotting disease. ? Long-term (chronic) lung or heart disease. ? Cancer treated with chemotherapy. ? A central venous catheter.  Taking medicines that contain estrogen. These include birth control pills and hormone replacement therapy.  Being: ? Pregnant. ? In the period of time after your baby is delivered (postpartum). ? Older than age 48. ? Overweight. ? A smoker, especially if you have other risks. What are the signs or symptoms? Symptoms of this condition usually start suddenly and include:  Shortness of breath during activity or at rest.  Coughing, coughing up blood, or coughing up blood-tinged mucus.  Chest pain that is often worse with deep breaths.  Rapid or irregular heartbeat.  Feeling light-headed or dizzy.  Fainting.  Feeling anxious.  Fever.  Sweating.  Pain and swelling in a leg. This is a symptom of DVT, which can lead to PE. How is this diagnosed? This condition may be diagnosed based on:  Your  medical history.  A physical exam.  Blood tests.  CT pulmonary angiogram. This test checks blood flow in and around your lungs.  Ventilation-perfusion scan, also called a lung VQ scan. This test measures air flow and blood flow to the lungs.  An ultrasound of the legs. How is this treated? Treatment for this condition depends on many factors, such as the cause of your PE, your risk for bleeding or developing more clots, and other medical conditions you have. Treatment aims to remove, dissolve, or stop blood clots from forming or growing larger. Treatment may include:  Medicines, such as: ? Blood thinning medicines (anticoagulants) to stop clots from forming. ? Medicines that dissolve clots (thrombolytics).  Procedures, such as: ? Using a flexible tube to remove a blood clot (embolectomy) or to deliver medicine to destroy it (catheter-directed thrombolysis). ? Inserting a filter into a large vein that carries blood to the heart (inferior vena cava). This filter (vena cava filter) catches blood clots before they reach the lungs. ? Surgery to remove the clot (surgical embolectomy). This is rare. You may need a combination of immediate, long-term (up to 3 months after diagnosis), and extended (more than 3 months after diagnosis) treatments. Your treatment may continue for several months (maintenance therapy). You and your health care provider will work together to choose the treatment program that is best for you. Follow these instructions at home: Medicines  Take over-the-counter and prescription medicines only as told by your health care provider.  If  you are taking an anticoagulant medicine: ? Take the medicine every day at the same time each day. ? Understand what foods and drugs interact with your medicine. ? Understand the side effects of this medicine, including excessive bruising or bleeding. Ask your health care provider or pharmacist about other side effects. General  instructions  Wear a medical alert bracelet or carry a medical alert card that says you have had a PE and lists what medicines you take.  Ask your health care provider when you may return to your normal activities. Avoid sitting or lying for a long time without moving.  Maintain a healthy weight. Ask your health care provider what weight is healthy for you.  Do not use any products that contain nicotine or tobacco, such as cigarettes, e-cigarettes, and chewing tobacco. If you need help quitting, ask your health care provider.  Talk with your health care provider about any travel plans. It is important to make sure that you are still able to take your medicine while on trips.  Keep all follow-up visits as told by your health care provider. This is important. Contact a health care provider if:  You missed a dose of your blood thinner medicine. Get help right away if:  You have: ? New or increased pain, swelling, warmth, or redness in an arm or leg. ? Numbness or tingling in an arm or leg. ? Shortness of breath during activity or at rest. ? A fever. ? Chest pain. ? A rapid or irregular heartbeat. ? A severe headache. ? Vision changes. ? A serious fall or accident, or you hit your head. ? Stomach (abdominal) pain. ? Blood in your vomit, stool, or urine. ? A cut that will not stop bleeding.  You cough up blood.  You feel light-headed or dizzy.  You cannot move your arms or legs.  You are confused or have memory loss. These symptoms may represent a serious problem that is an emergency. Do not wait to see if the symptoms will go away. Get medical help right away. Call your local emergency services (911 in the [place] Do not drive yourself to the hospital. Summary  A pulmonary embolism (PE) is a sudden blockage or decrease of blood flow in one or both lungs. PE is a dangerous and life-threatening condition that needs to be treated right away.  Treatments for this condition usually  include medicines to thin your blood (anticoagulants) or medicines to break apart blood clots (thrombolytics).  If you are given blood thinners, it is important to take the medicine every day at the same time each day.  Understand what foods and drugs interact with any medicines that you are taking.  If you have signs of PE or DVT, call your local emergency services (911 in the [place] This information is not intended to replace advice given to you by your health care provider. Make sure you discuss any questions you have with your health care provider. Document Revised: 08/17/2018 Document Reviewed: 08/17/2018 Elsevier Patient Education  2020 [employer]. Information on my medicine - XARELTO (rivaroxaban)  This medication education was reviewed with me or my healthcare representative as part of my discharge preparation.  The pharmacist that spoke with me during my hospital stay was:  Otho Bellows, [facility]  WHY WAS XARELTO PRESCRIBED FOR YOU? Xarelto was prescribed to treat blood clots that may have been found in the veins of your legs (deep vein thrombosis) or in your lungs (pulmonary embolism) and to reduce the  risk of them occurring again.  What do you need to know about Xarelto? The starting dose is one 15 mg tablet taken TWICE daily with food for the FIRST 21 DAYS then on (enter date)  4/17  the dose is changed to one 20 mg tablet taken ONCE A DAY with your evening meal.  DO NOT stop taking Xarelto without talking to the health care provider who prescribed the medication.  Refill your prescription for 20 mg tablets before you run out.  After discharge, you should have regular check-up appointments with your healthcare provider that is prescribing your Xarelto.  In the future your dose may need to be changed if your kidney function changes by a significant amount.  What do you do if you miss a dose? If you are taking Xarelto TWICE DAILY and you miss a dose, take it as soon as you  remember. You may take two 15 mg tablets (total 30 mg) at the same time then resume your regularly scheduled 15 mg twice daily the next day.  If you are taking Xarelto ONCE DAILY and you miss a dose, take it as soon as you remember on the same day then continue your regularly scheduled once daily regimen the next day. Do not take two doses of Xarelto at the same time.   Important Safety Information Xarelto is a blood thinner medicine that can cause bleeding. You should call your healthcare provider right away if you experience any of the following: ? Bleeding from an injury or your nose that does not stop. ? Unusual colored urine (red or dark brown) or unusual colored stools (red or black). ? Unusual bruising for unknown reasons. ? A serious fall or if you hit your head (even if there is no bleeding).  Some medicines may interact with Xarelto and might increase your risk of bleeding while on Xarelto. To help avoid this, consult your healthcare provider or pharmacist prior to using any new prescription or non-prescription medications, including herbals, vitamins, non-steroidal anti-inflammatory drugs (NSAIDs) and supplements.  This website has more information on Xarelto: [email or web address]

## 2020-02-17 NOTE — Progress Notes (Signed)
ANTICOAGULATION CONSULT NOTE  Pharmacy Consult for Xarelto, d/c Heparin Indication: pulmonary embolus  Allergies  Allergen Reactions  . Aspirin Other (See Comments)    Seizures.   . Demerol Other (See Comments)    Seizures.   . Lisinopril Cough  . Losartan     Sneezing and coughing  . Morphine Other (See Comments)    Seizures  . Oxycodone Other (See Comments)    seizures  . Penicillins Other (See Comments)    Seizures.Has patient had a PCN reaction causing immediate rash, facial/tongue/throat swelling, SOB or lightheadedness with hypotension: Yes Has patient had a PCN reaction causing severe rash involving mucus membranes or skin necrosis: Yes Has patient had a PCN reaction that required hospitalizationYes Has patient had a PCN reaction occurring within the last 10 years: Yes If all of the above answers are "NO", then may proceed with Cephalosporin use.   Marland Kitchen Phentermine     History of seizure disorder  . Trazodone Other (See Comments)    Hallucinations   Patient Measurements: Height: 5\' 7"  (170.2 cm) Weight: 240 lb (108.9 kg) IBW/kg (Calculated) : 61.6 Heparin Dosing Weight: 87 kg  Vital Signs: Temp: 97.5 F (36.4 C) (03/27 0428) Temp Source: Oral (03/27 0428) BP: 147/71 (03/27 0428) Pulse Rate: 86 (03/27 0428)  Labs: Recent Labs    02/15/20 1601 02/15/20 1601 02/15/20 1921 02/15/20 2225 02/16/20 0242 02/16/20 1005 02/16/20 1714 02/17/20 0042 02/17/20 0825  HGB 13.5   < >  --   --  12.3  --   --  12.1  --   HCT 43.4  --   --   --  39.4  --   --  38.1  --   PLT 193  --   --   --  187  --   --  254  --   LABPROT  --   --   --   --  14.8  --   --   --   --   INR  --   --   --   --  1.2  --   --   --   --   HEPARINUNFRC  --   --   --   --  0.97*   < > 0.37 0.24* 0.44  CREATININE 0.58  --   --   --  0.59  --   --  0.63  --   TROPONINIHS  --   --  17 19* 17  --   --   --   --    < > = values in this interval not displayed.   Estimated Creatinine Clearance:  98.6 mL/min (by C-G formula based on SCr of 0.63 mg/dL).  Assessment: 10 yoF presents with SOB. Pt was recently hospitalized with COVID PNA in Feb 2021. CT angio (3/25): + for bilateral PE including saddle embolus. IV heparin initiated, pharmacy to dose. Pt not on anticoagulants PTA.  Today, 02/17/20  am heparin level 0.44 units/ml  CBC: WNL & stable  SCr stable at baseline  Transition to Xarelto this morning  Goal of Therapy:  Heparin level 0.3-0.7 units/ml Monitor platelets by anticoagulation protocol   Plan:  Discontinue Heparin at time of first Xarelto dose For PE, Xarelto 15mg  bid x 3 weeks, then Xarelto 20mg  daily Patient education  02/19/20, PharmD 02/17/20 9:28 AM

## 2020-02-17 NOTE — Progress Notes (Signed)
Pt discharged from the unit via wheelchair. Discharge instructions were reviewed with patient at bedside. Pharmacy provided blood thinner education prior to discharge. No questions or concerns from the pt at this time.

## 2020-02-17 NOTE — Progress Notes (Addendum)
Brief Pharmacy Consult Note - IV Heparin  Labs: heparin level 0.24  A/P: Heparin level SUBtherapeutic (goal 0.3-0.7) on current IV heparin rate of 1100 units/hr. Per RN, no problems with IV site. Increase IV heparin from current rate to 1200 units/hr. Recheck heparin level 6 hours after rate change  Hessie Knows, PharmD, BCPS 02/17/2020 1:34 AM

## 2020-04-15 ENCOUNTER — Ambulatory Visit: Payer: Medicare HMO | Admitting: Internal Medicine

## 2020-04-24 ENCOUNTER — Other Ambulatory Visit: Payer: Self-pay

## 2020-04-26 ENCOUNTER — Ambulatory Visit: Payer: Medicare HMO | Admitting: Internal Medicine

## 2021-03-14 ENCOUNTER — Ambulatory Visit: Payer: Medicare HMO | Admitting: Dietician

## 2021-05-05 ENCOUNTER — Ambulatory Visit: Payer: Medicare Other | Admitting: Dietician

## 2021-05-28 ENCOUNTER — Encounter (HOSPITAL_COMMUNITY): Payer: Self-pay

## 2021-05-28 ENCOUNTER — Emergency Department (HOSPITAL_COMMUNITY): Payer: Medicare Other

## 2021-05-28 ENCOUNTER — Other Ambulatory Visit: Payer: Self-pay

## 2021-05-28 ENCOUNTER — Emergency Department (HOSPITAL_COMMUNITY)
Admission: EM | Admit: 2021-05-28 | Discharge: 2021-05-28 | Disposition: A | Discharge status: Home / Self Care | Payer: Medicare Other | Attending: Emergency Medicine | Admitting: Emergency Medicine

## 2021-05-28 DIAGNOSIS — Z79899 Other long term (current) drug therapy: Secondary | ICD-10-CM | POA: Diagnosis not present

## 2021-05-28 DIAGNOSIS — Z7901 Long term (current) use of anticoagulants: Secondary | ICD-10-CM | POA: Diagnosis not present

## 2021-05-28 DIAGNOSIS — R000 Tachycardia, unspecified: Secondary | ICD-10-CM | POA: Insufficient documentation

## 2021-05-28 DIAGNOSIS — I2510 Atherosclerotic heart disease of native coronary artery without angina pectoris: Secondary | ICD-10-CM | POA: Insufficient documentation

## 2021-05-28 DIAGNOSIS — M791 Myalgia, unspecified site: Secondary | ICD-10-CM | POA: Diagnosis not present

## 2021-05-28 DIAGNOSIS — E119 Type 2 diabetes mellitus without complications: Secondary | ICD-10-CM | POA: Diagnosis not present

## 2021-05-28 DIAGNOSIS — R059 Cough, unspecified: Secondary | ICD-10-CM | POA: Diagnosis not present

## 2021-05-28 DIAGNOSIS — Z20822 Contact with and (suspected) exposure to covid-19: Secondary | ICD-10-CM | POA: Insufficient documentation

## 2021-05-28 DIAGNOSIS — R197 Diarrhea, unspecified: Secondary | ICD-10-CM | POA: Diagnosis not present

## 2021-05-28 DIAGNOSIS — I10 Essential (primary) hypertension: Secondary | ICD-10-CM | POA: Insufficient documentation

## 2021-05-28 DIAGNOSIS — R519 Headache, unspecified: Secondary | ICD-10-CM

## 2021-05-28 DIAGNOSIS — Z8616 Personal history of COVID-19: Secondary | ICD-10-CM | POA: Diagnosis not present

## 2021-05-28 DIAGNOSIS — Z87891 Personal history of nicotine dependence: Secondary | ICD-10-CM | POA: Diagnosis not present

## 2021-05-28 DIAGNOSIS — J101 Influenza due to other identified influenza virus with other respiratory manifestations: Secondary | ICD-10-CM

## 2021-05-28 DIAGNOSIS — R509 Fever, unspecified: Secondary | ICD-10-CM | POA: Diagnosis not present

## 2021-05-28 DIAGNOSIS — Z7984 Long term (current) use of oral hypoglycemic drugs: Secondary | ICD-10-CM | POA: Diagnosis not present

## 2021-05-28 LAB — BASIC METABOLIC PANEL
Anion gap: 8 (ref 5–15)
BUN: 5 mg/dL — ABNORMAL LOW (ref 6–20)
CO2: 25 mmol/L (ref 22–32)
Calcium: 7.3 mg/dL — ABNORMAL LOW (ref 8.9–10.3)
Chloride: 105 mmol/L (ref 98–111)
Creatinine, Ser: 0.54 mg/dL (ref 0.44–1.00)
GFR, Estimated: >60 mL/min (ref >60)
Glucose, Bld: 288 mg/dL — ABNORMAL HIGH (ref 70–99)
Potassium: 3.5 mmol/L (ref 3.5–5.1)
Sodium: 138 mmol/L (ref 135–145)

## 2021-05-28 LAB — CBC WITH DIFFERENTIAL/PLATELET
Abs Immature Granulocytes: 0.04 10*3/uL (ref 0.00–0.07)
Basophils Absolute: 0 10*3/uL (ref 0.0–0.1)
Basophils Relative: 0 %
Eosinophils Absolute: 0 10*3/uL (ref 0.0–0.5)
Eosinophils Relative: 0 %
HCT: 39.5 % (ref 36.0–46.0)
Hemoglobin: 12.4 g/dL (ref 12.0–15.0)
Immature Granulocytes: 1 %
Lymphocytes Relative: 11 %
Lymphs Abs: 0.8 10*3/uL (ref 0.7–4.0)
MCH: 27.1 pg (ref 26.0–34.0)
MCHC: 31.4 g/dL (ref 30.0–36.0)
MCV: 86.2 fL (ref 80.0–100.0)
Monocytes Absolute: 0.7 10*3/uL (ref 0.1–1.0)
Monocytes Relative: 11 %
Neutro Abs: 5.2 10*3/uL (ref 1.7–7.7)
Neutrophils Relative %: 77 %
Platelets: 276 10*3/uL (ref 150–400)
RBC: 4.58 MIL/uL (ref 3.87–5.11)
RDW: 14.4 % (ref 11.5–15.5)
WBC: 6.7 10*3/uL (ref 4.0–10.5)
nRBC: 0 % (ref 0.0–0.2)

## 2021-05-28 LAB — RESP PANEL BY RT-PCR (FLU A&B, COVID) ARPGX2
Influenza A by PCR: POSITIVE — AB
Influenza B by PCR: NEGATIVE
SARS Coronavirus 2 by RT PCR: NEGATIVE

## 2021-05-28 MED ORDER — MAGNESIUM SULFATE IN D5W 1-5 GM/100ML-% IV SOLN
1.0000 g | Freq: Once | INTRAVENOUS | Status: AC
  Administered 2021-05-28: 1 g via INTRAVENOUS
  Filled 2021-05-28: qty 100

## 2021-05-28 MED ORDER — PROMETHAZINE-DM 6.25-15 MG/5ML PO SYRP: 5.0000 mL | ORAL_SOLUTION | Freq: Four times a day (QID) | ORAL | 0 refills | Status: DC | PRN

## 2021-05-28 MED ORDER — ACETAMINOPHEN 500 MG PO TABS: 1000.0000 mg | ORAL_TABLET | Freq: Four times a day (QID) | ORAL | Status: DC | PRN

## 2021-05-28 MED ORDER — SODIUM CHLORIDE 0.9 % IV BOLUS
1000.0000 mL | Freq: Once | INTRAVENOUS | Status: AC
  Administered 2021-05-28: 1000 mL via INTRAVENOUS

## 2021-05-28 MED ORDER — MAGNESIUM SULFATE 50 % IJ SOLN: 1.0000 g | Freq: Once | INTRAMUSCULAR | Status: DC

## 2021-05-28 MED ORDER — OSELTAMIVIR PHOSPHATE 75 MG PO CAPS: 75.0000 mg | ORAL_CAPSULE | Freq: Two times a day (BID) | ORAL | 0 refills | Status: AC

## 2021-05-28 MED ORDER — KETOROLAC TROMETHAMINE 15 MG/ML IJ SOLN
15.0000 mg | Freq: Once | INTRAMUSCULAR | Status: AC
  Administered 2021-05-28: 15 mg via INTRAVENOUS
  Filled 2021-05-28: qty 1

## 2021-05-28 MED ORDER — BENZONATATE 100 MG PO CAPS: 100.0000 mg | ORAL_CAPSULE | Freq: Three times a day (TID) | ORAL | 0 refills | Status: AC

## 2021-05-28 NOTE — ED Triage Notes (Signed)
EMS reports from home, fever, chills, body aches, coughing and diarrhea since 0900 yesterday. "Pt she feels the same as she did last year when she had Covid."  BP 181/90 HR 102 RR 22 Sp02 90 RA Temp 102.4  Tylenol 1000mg  enroute  20ga L forearm

## 2021-05-28 NOTE — Discharge Instructions (Addendum)
I have renewed her prescription for her to cough medications.  1 of these is can cause drowsiness so take with caution and do not mix with any other drowsy medicines.  Take Tylenol and ibuprofen as discussed below.  Drink plenty of water.  Follow-up with your primary care provider.  You may always return to the ER for new or concerning symptoms.  Please use Tylenol or ibuprofen for pain.  You may use 600 mg ibuprofen every 6 hours or 1000 mg of Tylenol every 6 hours.  You may choose to alternate between the 2.  This would be most effective.  Not to exceed 4 g of Tylenol within 24 hours.  Not to exceed 3200 mg ibuprofen 24 hours.

## 2021-05-28 NOTE — ED Provider Notes (Signed)
[facility] Note   CSN: [id number] Arrival date & time: 05/28/21  1335     History Chief Complaint  Patient presents with   Fever   Cough   Generalized Body Aches   Chills    Sierra Ellis is a 60 y.o. female.  Patient with history of pneumonia, seizures, COVID last year with subsequent PE, not currently on anticoagulation --presents the emergency department today for evaluation of febrile illness.  Symptoms began yesterday.  Patient reports fever and chills, body aches, coughing.  She had mild diarrhea at onset which is improved today.  She went to her primary care doctor and had COVID and flu testing.  Flu testing was negative.  She reports history of pneumonia with COVID requiring hospitalization in early 2021.  No vomiting.  No skin rashes or leg swelling.  Onset of symptoms acute.  Course is constant.      Past Medical History:  Diagnosis Date   Abnormal Pap smear of cervix 2011   S/P "freezing it all out" likely colposcopy with cryoablation   Coronary artery disease involving native coronary artery of native heart without angina pectoris 01/21/2020   Diabetes mellitus    Essential hypertension 01/17/2012   Pneumonia due to COVID-19 virus 01/20/2020   Seizures (HCC)    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/17/2012   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    Patient Active Problem List   Diagnosis Date Noted   Acute saddle pulmonary embolism without acute cor pulmonale (HCC) 02/15/2020   Severe obesity (BMI >= 40) (HCC) 01/24/2020   Acute respiratory failure with hypoxia (HCC) 01/24/2020   Hypokalemia 01/21/2020   Coronary artery disease involving native coronary artery of native heart without angina pectoris 01/21/2020   OSA (obstructive sleep apnea) 01/21/2020   Former smoker 01/02/2020   History of seizure 01/02/2020   Gastroesophageal reflux disease without esophagitis 01/02/2020   Carpal tunnel syndrome 11/25/2016    Peripheral neuropathy 01/30/2016   Nonepileptic episode (HCC) 01/18/2012   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     Family History  Problem Relation Age of Onset   Stroke Mother    Stroke Father     Social History   Tobacco Use   Smoking status: Former    Pack years: 0.00   Smokeless tobacco: Never  Substance Use Topics   Alcohol use: No   Drug use: No    Home Medications Prior to Admission medications   Medication Sig Start Date End Date Taking? Authorizing Provider  albuterol (VENTOLIN HFA) 108 (90 Base) MCG/ACT inhaler Inhale 2 puffs into the lungs every 6 (six) hours as needed for wheezing or shortness of breath. 01/25/20   Roberto Scales D, MD  amLODipine (NORVASC) 5 MG tablet Take 5 mg by mouth daily. 01/25/20   [provider]  atorvastatin (LIPITOR) 20 MG tablet Take 20  mg by mouth daily. 01/02/20   [provider]  chlorpheniramine-HYDROcodone (TUSSIONEX) 10-8 MG/5ML SUER Take 5 mLs by mouth every 12 (twelve) hours as needed (severe cough). 02/17/20   Rai, Delene Ruffini, MD  cholecalciferol (VITAMIN D3) 25 MCG (1000 UT) tablet Take 1,000 Units by mouth daily.    [provider]  Dextromethorphan Polistirex (DELSYM PO) Take 1 tablet by mouth daily as needed (cough).    [provider]  diltiazem (CARDIZEM CD) 120 MG 24 hr capsule Take 120 mg by mouth  daily.  01/25/20   [provider]  furosemide (LASIX) 20 MG tablet Take 20 mg by mouth daily.  01/02/20   [provider]  guaiFENesin-dextromethorphan (ROBITUSSIN DM) 100-10 MG/5ML syrup Take 10 mLs by mouth every 4 (four) hours as needed for cough. 01/25/20   Laverna Peace, MD  KLOR-CON M20 20 MEQ tablet Take 20 mEq by mouth daily. 01/02/20   [provider]  LANTUS SOLOSTAR 100 UNIT/ML Solostar Pen Inject 30 Units into the skin at bedtime. 02/17/20   Rai, Delene Ruffini, MD  meclizine (ANTIVERT) 25 MG tablet Take 1 tablet (25 mg total) by mouth 3 (three) times daily as needed for dizziness. 07/20/19   Petrucelli, Samantha R, PA-C  metFORMIN (GLUCOPHAGE) 500 MG tablet Take 500-1,500 mg by mouth See admin instructions. 500mg  in AM and 1500mg  qhs    [provider]  polyethylene glycol (MIRALAX / GLYCOLAX) 17 g packet Take 17 g by mouth daily as needed for mild constipation (Also available over-the-counter). 02/17/20   Rai, , MD  rivaroxaban (XARELTO) 20 MG TABS tablet Take 1 tablet (20 mg total) by mouth daily with breakfast. Please start taking after you have completed the starter pack 03/09/20   Rai, Delene Ruffini, MD  Rivaroxaban 15 & 20 MG TBPK Follow package directions: Take one 15mg  tablet by mouth twice a day. On day 22 (03/09/2020) , switch to one 20mg  tablet once a day. Take with food. 02/17/20   Rai, , MD  vitamin C (ASCORBIC ACID) 250 MG tablet Take 250 mg by mouth daily.    [provider]  zinc sulfate 220 (50 Zn) MG capsule Take 1 capsule (220 mg total) by mouth daily. 01/25/20   D, MD    Allergies    Aspirin, Demerol, Lisinopril, Losartan, Morphine, Oxycodone, Penicillins, Phentermine, and Trazodone  Review of Systems   Review of Systems  Constitutional:  Positive for chills, fatigue and fever.  HENT:  Negative for congestion, ear pain, rhinorrhea, sinus pressure and sore throat.   Eyes:  Negative for redness.   Respiratory:  Positive for cough. Negative for wheezing.   Gastrointestinal:  Positive for abdominal pain and diarrhea (resolved). Negative for nausea and vomiting.  Genitourinary:  Negative for dysuria.  Musculoskeletal:  Positive for myalgias. Negative for neck stiffness.  Skin:  Negative for rash.  Neurological:  Negative for headaches.  Hematological:  Negative for adenopathy.   Physical Exam Updated Vital Signs BP (!) 189/86 (BP Location: Left Arm)   Pulse (!) 119   Temp 99.9 F (37.7 C) (Oral)   Resp 18   LMP 06/07/2019 (Approximate)   SpO2 97%   Physical Exam Vitals and nursing note reviewed.  Constitutional:      General: She is not in acute distress.    Appearance: She is well-developed.  HENT:     Head: Normocephalic and atraumatic.     Right Ear: External ear normal.  Left Ear: External ear normal.     Nose: Nose normal.  Eyes:     Conjunctiva/sclera: Conjunctivae normal.  Cardiovascular:     Rate and Rhythm: Regular rhythm. Tachycardia present.     Heart sounds: No murmur heard. Pulmonary:     Effort: No respiratory distress.     Breath sounds: No wheezing, rhonchi or rales.  Abdominal:     Palpations: Abdomen is soft.     Tenderness: There is no abdominal tenderness. There is no guarding or rebound.  Musculoskeletal:     Cervical back: Normal range of motion and neck supple.     Right lower leg: No edema.     Left lower leg: No edema.  Skin:    General: Skin is warm and dry.     Findings: No rash.  Neurological:     General: No focal deficit present.     Mental Status: She is alert. Mental status is at baseline.     Motor: No weakness.  Psychiatric:        Mood and Affect: Mood normal.    ED Results / Procedures / Treatments   Labs (all labs ordered are listed, but only abnormal results are displayed) Labs Reviewed  SARS CORONAVIRUS 2 (TAT 6-24 HRS)  RESP PANEL BY RT-PCR (FLU A&B, COVID) ARPGX2  CBC WITH DIFFERENTIAL/PLATELET  BASIC  METABOLIC PANEL    EKG None  Radiology No results found.  Procedures Procedures   Medications Ordered in ED Medications  sodium chloride 0.9 % bolus 1,000 mL (1,000 mLs Intravenous New Bag/Given 05/28/21 1447)  ketorolac (TORADOL) 15 MG/ML injection 15 mg (15 mg Intravenous Given 05/28/21 1446)    ED Course  I have reviewed the triage vital signs and the nursing notes.  Pertinent labs & imaging results that were available during my care of the patient were reviewed by me and considered in my medical decision making (see chart for details).  Patient seen and examined. Work-up initiated. Medications ordered. Tylenol by EMS. Overall she looks okay. Given history will check CXR, labs, give fluid bolus, 15mg  toradol.   Vital signs reviewed and are as follows: BP (!) 189/86 (BP Location: Left Arm)   Pulse (!) 119   Temp 99.9 F (37.7 C) (Oral)   Resp 18   LMP 06/07/2019 (Approximate)   SpO2 97%   3:21 PM pending completion of work-up. [place] PA-C at shift change.   If work-up is reassuring, patient can likely be discharged to home.  She would be a candidate for Paxlovid, if positive for COVID.    MDM Rules/Calculators/A&P                          Pending completion of work-up, IV fluids and treatments here.  Low concern for ACS, PE.  Symptoms most consistent with COVID-like illness.   Final Clinical Impression(s) / ED Diagnoses Final diagnoses:  None    Rx / DC Orders ED Discharge Orders     None        06/09/2019, Renne Crigler 05/28/21 1522    07/29/21, MD 05/29/21 [phone]

## 2021-05-28 NOTE — ED Provider Notes (Signed)
Accepted handoff at shift change from [employer]. Please see prior provider note for more detail.   Briefly: Patient is 60 y.o.  Per prior provider   "Patient with history of pneumonia, seizures, COVID last year with subsequent PE, not currently on anticoagulation --presents the emergency department today for evaluation of febrile illness.  Symptoms began yesterday.  Patient reports fever and chills, body aches, coughing.  She had mild diarrhea at onset which is improved today.  She went to her primary care doctor and had COVID and flu testing.  Flu testing was negative.  She reports history of pneumonia with COVID requiring hospitalization in early 2021.  No vomiting.  No skin rashes or leg swelling.  Onset of symptoms acute.  Course is constant."    DDX: concern for COVID or other viral illness  Plan: FU on labs, re-evaluate   Physical Exam  BP (!) 170/84 (BP Location: Right Arm)   Pulse 100   Temp 99 F (37.2 C) (Oral)   Resp 20   LMP 06/07/2019 (Approximate)   SpO2 95%   Physical Exam  ED Course/Procedures     Procedures Results for orders placed or performed during the hospital encounter of 05/28/21  Resp Panel by RT-PCR (Flu A&B, Covid) Nasopharyngeal Swab   Specimen: Nasopharyngeal Swab; Nasopharyngeal(NP) swabs in vial transport medium  Result Value Ref Range   SARS Coronavirus 2 by RT PCR NEGATIVE NEGATIVE   Influenza A by PCR POSITIVE (A) NEGATIVE   Influenza B by PCR NEGATIVE NEGATIVE  CBC with Differential/Platelet  Result Value Ref Range   WBC 6.7 4.0 - 10.5 K/uL   RBC 4.58 3.87 - 5.11 MIL/uL   Hemoglobin 12.4 12.0 - 15.0 g/dL   HCT 16.0 10.9 - 32.3 %   MCV 86.2 80.0 - 100.0 fL   MCH 27.1 26.0 - 34.0 pg   MCHC 31.4 30.0 - 36.0 g/dL   RDW 55.7 32.2 - 02.5 %   Platelets 276 150 - 400 K/uL   nRBC 0.0 0.0 - 0.2 %   Neutrophils Relative % 77 %   Neutro Abs 5.2 1.7 - 7.7 K/uL   Lymphocytes Relative 11 %   Lymphs Abs 0.8 0.7 - 4.0 K/uL   Monocytes Relative  11 %   Monocytes Absolute 0.7 0.1 - 1.0 K/uL   Eosinophils Relative 0 %   Eosinophils Absolute 0.0 0.0 - 0.5 K/uL   Basophils Relative 0 %   Basophils Absolute 0.0 0.0 - 0.1 K/uL   Immature Granulocytes 1 %   Abs Immature Granulocytes 0.04 0.00 - 0.07 K/uL  Basic metabolic panel  Result Value Ref Range   Sodium 138 135 - 145 mmol/L   Potassium 3.5 3.5 - 5.1 mmol/L   Chloride 105 98 - 111 mmol/L   CO2 25 22 - 32 mmol/L   Glucose, Bld 288 (H) 70 - 99 mg/dL   BUN 5 (L) 6 - 20 mg/dL   Creatinine, Ser 4.27 0.44 - 1.00 mg/dL   Calcium 7.3 (L) 8.9 - 10.3 mg/dL   GFR, Estimated >06 >23 mL/min   Anion gap 8 5 - 15   DG Chest Port 1 View  Result Date: 05/28/2021 CLINICAL DATA:  cough, fever, possible COVID EXAM: PORTABLE CHEST 1 VIEW.  AP portable semi erect/lordotic. COMPARISON:  Chest x-ray 01/20/2020, CT angiography chest 02/15/2020. FINDINGS: The heart size and mediastinal contours are unchanged. No focal consolidation. No pulmonary edema. No pleural effusion. No pneumothorax. No acute osseous abnormality. IMPRESSION: No  active disease. Electronically Signed   By: Tish Frederickson M.D.   On: 05/28/2021 15:45    MDM   Patient is 60 year old female with past medical history detailed in HPI patient is presented today with fevers chills body aches coughing and diarrhea since 9:00 yesterday.  Feels similar to how she felt when she had COVID last year.  Found to have influenza A.  CBC and CMP unremarkable apart from hyperglycemia on BMP.  This without any anion gap.  She received IV hydration here in the ER.  Toradol given and on my assessment patient is complaining of some headache we will provide with 1 g of magnesium  She states she feels much improved.  Her tachycardia resolved prior to discharge.  Provided patient with conservative therapy in the form of Tylenol ibuprofen recommendations Tessalon Perles and Promethazine DM also offered patient Tamiflu which she states she would prefer to have I  printed prescription for this as well.  Return precautions given.  P.o. challenge before discharge.      Gailen Shelter, [place] 05/28/21 2042    Laurence Spates, MD 05/29/21 1806

## 2021-06-05 ENCOUNTER — Emergency Department (HOSPITAL_COMMUNITY): Payer: Medicare Other

## 2021-06-05 ENCOUNTER — Encounter (HOSPITAL_COMMUNITY): Payer: Self-pay | Admitting: *Deleted

## 2021-06-05 ENCOUNTER — Emergency Department (HOSPITAL_COMMUNITY)
Admission: EM | Admit: 2021-06-05 | Discharge: 2021-06-05 | Disposition: A | Discharge status: Home / Self Care | Payer: Medicare Other | Attending: Emergency Medicine | Admitting: Emergency Medicine

## 2021-06-05 DIAGNOSIS — Z79899 Other long term (current) drug therapy: Secondary | ICD-10-CM | POA: Insufficient documentation

## 2021-06-05 DIAGNOSIS — E1142 Type 2 diabetes mellitus with diabetic polyneuropathy: Secondary | ICD-10-CM | POA: Diagnosis not present

## 2021-06-05 DIAGNOSIS — R112 Nausea with vomiting, unspecified: Secondary | ICD-10-CM | POA: Insufficient documentation

## 2021-06-05 DIAGNOSIS — Z7984 Long term (current) use of oral hypoglycemic drugs: Secondary | ICD-10-CM | POA: Insufficient documentation

## 2021-06-05 DIAGNOSIS — Z7901 Long term (current) use of anticoagulants: Secondary | ICD-10-CM | POA: Insufficient documentation

## 2021-06-05 DIAGNOSIS — Z8616 Personal history of COVID-19: Secondary | ICD-10-CM | POA: Insufficient documentation

## 2021-06-05 DIAGNOSIS — I10 Essential (primary) hypertension: Secondary | ICD-10-CM | POA: Insufficient documentation

## 2021-06-05 DIAGNOSIS — E782 Mixed hyperlipidemia: Secondary | ICD-10-CM | POA: Insufficient documentation

## 2021-06-05 DIAGNOSIS — Z87891 Personal history of nicotine dependence: Secondary | ICD-10-CM | POA: Insufficient documentation

## 2021-06-05 DIAGNOSIS — Z8669 Personal history of other diseases of the nervous system and sense organs: Secondary | ICD-10-CM | POA: Diagnosis not present

## 2021-06-05 DIAGNOSIS — E1169 Type 2 diabetes mellitus with other specified complication: Secondary | ICD-10-CM | POA: Diagnosis not present

## 2021-06-05 DIAGNOSIS — R42 Dizziness and giddiness: Secondary | ICD-10-CM | POA: Diagnosis not present

## 2021-06-05 DIAGNOSIS — R059 Cough, unspecified: Secondary | ICD-10-CM | POA: Insufficient documentation

## 2021-06-05 DIAGNOSIS — Z794 Long term (current) use of insulin: Secondary | ICD-10-CM | POA: Diagnosis not present

## 2021-06-05 DIAGNOSIS — R509 Fever, unspecified: Secondary | ICD-10-CM | POA: Diagnosis not present

## 2021-06-05 DIAGNOSIS — R519 Headache, unspecified: Secondary | ICD-10-CM | POA: Diagnosis present

## 2021-06-05 DIAGNOSIS — R202 Paresthesia of skin: Secondary | ICD-10-CM | POA: Diagnosis not present

## 2021-06-05 DIAGNOSIS — I2510 Atherosclerotic heart disease of native coronary artery without angina pectoris: Secondary | ICD-10-CM | POA: Insufficient documentation

## 2021-06-05 LAB — URINALYSIS, ROUTINE W REFLEX MICROSCOPIC
Bilirubin Urine: NEGATIVE
Glucose, UA: 50 mg/dL — AB
Hgb urine dipstick: NEGATIVE
Ketones, ur: NEGATIVE mg/dL
Nitrite: NEGATIVE
Protein, ur: NEGATIVE mg/dL
Specific Gravity, Urine: 1.005 (ref 1.005–1.030)
pH: 6 (ref 5.0–8.0)

## 2021-06-05 LAB — CBC WITH DIFFERENTIAL/PLATELET
Abs Immature Granulocytes: 0.04 10*3/uL (ref 0.00–0.07)
Basophils Absolute: 0 10*3/uL (ref 0.0–0.1)
Basophils Relative: 0 %
Eosinophils Absolute: 0.1 10*3/uL (ref 0.0–0.5)
Eosinophils Relative: 1 %
HCT: 41.1 % (ref 36.0–46.0)
Hemoglobin: 13.1 g/dL (ref 12.0–15.0)
Immature Granulocytes: 1 %
Lymphocytes Relative: 33 %
Lymphs Abs: 2.6 10*3/uL (ref 0.7–4.0)
MCH: 27.5 pg (ref 26.0–34.0)
MCHC: 31.9 g/dL (ref 30.0–36.0)
MCV: 86.2 fL (ref 80.0–100.0)
Monocytes Absolute: 0.6 10*3/uL (ref 0.1–1.0)
Monocytes Relative: 8 %
Neutro Abs: 4.6 10*3/uL (ref 1.7–7.7)
Neutrophils Relative %: 57 %
Platelets: 370 10*3/uL (ref 150–400)
RBC: 4.77 MIL/uL (ref 3.87–5.11)
RDW: 14.2 % (ref 11.5–15.5)
WBC: 7.9 10*3/uL (ref 4.0–10.5)
nRBC: 0 % (ref 0.0–0.2)

## 2021-06-05 LAB — COMPREHENSIVE METABOLIC PANEL
ALT: 19 U/L (ref 0–44)
AST: 15 U/L (ref 15–41)
Albumin: 3.6 g/dL (ref 3.5–5.0)
Alkaline Phosphatase: 124 U/L (ref 38–126)
Anion gap: 11 (ref 5–15)
BUN: 6 mg/dL (ref 6–20)
CO2: 28 mmol/L (ref 22–32)
Calcium: 9.2 mg/dL (ref 8.9–10.3)
Chloride: 97 mmol/L — ABNORMAL LOW (ref 98–111)
Creatinine, Ser: 0.52 mg/dL (ref 0.44–1.00)
GFR, Estimated: >60 mL/min (ref >60)
Glucose, Bld: 292 mg/dL — ABNORMAL HIGH (ref 70–99)
Potassium: 3.6 mmol/L (ref 3.5–5.1)
Sodium: 136 mmol/L (ref 135–145)
Total Bilirubin: 0.5 mg/dL (ref 0.3–1.2)
Total Protein: 6.9 g/dL (ref 6.5–8.1)

## 2021-06-05 LAB — CBG MONITORING, ED: Glucose-Capillary: 248 mg/dL — ABNORMAL HIGH (ref 70–99)

## 2021-06-05 LAB — LIPASE, BLOOD: Lipase: 22 U/L (ref 11–51)

## 2021-06-05 MED ORDER — DIPHENHYDRAMINE HCL 50 MG/ML IJ SOLN
50.0000 mg | Freq: Once | INTRAMUSCULAR | Status: DC
  Filled 2021-06-05: qty 1

## 2021-06-05 MED ORDER — METOCLOPRAMIDE HCL 5 MG/ML IJ SOLN
10.0000 mg | Freq: Once | INTRAMUSCULAR | Status: AC
  Administered 2021-06-05: 10 mg via INTRAVENOUS
  Filled 2021-06-05: qty 2

## 2021-06-05 MED ORDER — SCOPOLAMINE 1 MG/3DAYS TD PT72
1.0000 | MEDICATED_PATCH | TRANSDERMAL | Status: DC
  Administered 2021-06-05: 1.5 mg via TRANSDERMAL
  Filled 2021-06-05: qty 1

## 2021-06-05 MED ORDER — AMLODIPINE BESYLATE 5 MG PO TABS
5.0000 mg | ORAL_TABLET | Freq: Once | ORAL | Status: AC
  Administered 2021-06-05: 5 mg via ORAL
  Filled 2021-06-05: qty 1

## 2021-06-05 MED ORDER — SODIUM CHLORIDE 0.9 % IV BOLUS
500.0000 mL | Freq: Once | INTRAVENOUS | Status: AC
  Administered 2021-06-05: 500 mL via INTRAVENOUS

## 2021-06-05 NOTE — ED Triage Notes (Signed)
Per EMS, pr from home, experiencing vertigo. She states symptoms are worse than usual. She took meclizine this morning w/o relief. Unsteady walking. Also nausea vomiting, unable to keep regular meds down.   BP 180/100,  HR 102 SPO2 98 Temp 97.9 CBG 330

## 2021-06-05 NOTE — Discharge Instructions (Addendum)
You came to the emerge department today to be evaluated for your headache and dizziness.  Your physical exam and lab work were reassuring.  Symptoms improved after receiving migraine cocktail and scopolamine patch.  Please continue to take your meclizine as prescribed.  Please follow-up with your primary care provider as needed.  Get help right away if: Your headache becomes severe quickly. Your headache gets worse after moderate to intense physical activity. You have repeated vomiting. You have a stiff neck. You have a loss of vision. You have problems with speech. You have pain in the eye or ear. You have muscular weakness or loss of muscle control. You lose your balance or have trouble walking. You feel faint or pass out. You have confusion. You have a seizure.

## 2021-06-05 NOTE — ED Provider Notes (Signed)
[facility]-EMERGENCY DEPT Provider Note   CSN: [id number] Arrival date & time: 06/05/21  1225     History Chief Complaint  Patient presents with   Dizziness   Headache    MICHALE Ellis is a 60 y.o. female with a history of diabetes mellitus, hypertension, seizures, CAD.  Patient presents emergency department with a chief complaint of dizziness and headache.  Patient reports that her symptoms began at 0 400 this morning.  Patient reports that she woke up and felt like the whole room was spinning.  Dizziness improved slightly so she attempted to go to the bathroom however upon standing dizziness became worse causing her to fall.  Patient denies hitting her head or any loss of consciousness.  Patient took her prescribed meclizine with resolution of symptoms for 10 minutes however since then patient has had episodic dizziness.  Patient reports that dizziness occurs approximately every 20 minutes and is worse with change in position or standing.  Patient reports that she has headache associated with her dizziness.  States that headache onset is gradual and gets progressively worse over time.  Pain starts in occipital region and moves throughout her entire head.  At present patient rates pain 8/10 on the pain scale.  Patient reports history of migraine headaches.  States that headaches today are consistent with migraine headache she has had in the past.  Patient endorses associated nausea and vomiting.  States that she has vomited 3 times today.  Patient describes emesis as stomach contents.  Patient denies any hematemesis or coffee-ground emesis.  Patient reports that she vomited after taking all of her home medications earlier this morning.  Patient also endorses fevers and chills.  States that she was diagnosed with the flu last week.  Patient has baseline numbness to bilateral feet.  No other numbness reported.  Patient denies any neck pain, back pain, facial asymmetry,  slurred speech, visual disturbance, seizure, weakness, bowel or bladder dysfunction, abdominal pain, chest pain, shortness of breath, palpitations, leg swelling.         Dizziness Associated symptoms: headaches, nausea and vomiting   Associated symptoms: no blood in stool, no chest pain, no diarrhea, no shortness of breath and no weakness   Headache Associated symptoms: cough, dizziness, fever, nausea, numbness (baseline bilateral feet) and vomiting   Associated symptoms: no abdominal pain, no back pain, no diarrhea, no neck pain, no seizures and no weakness       Past Medical History:  Diagnosis Date   Abnormal Pap smear of cervix 2011   S/P "freezing it all out" likely colposcopy with cryoablation   Coronary artery disease involving native coronary artery of native heart without angina pectoris 01/21/2020   Diabetes mellitus    Essential hypertension 01/17/2012   Pneumonia due to COVID-19 virus 01/20/2020   Seizures (HCC)    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/17/2012   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    Patient Active Problem List   Diagnosis Date Noted   Acute saddle pulmonary embolism without acute cor pulmonale (HCC) 02/15/2020   Severe obesity (BMI >= 40) (HCC) 01/24/2020   Acute respiratory failure with hypoxia (HCC) 01/24/2020   Hypokalemia 01/21/2020   Coronary artery disease involving native coronary artery of native heart without angina pectoris 01/21/2020   OSA (obstructive sleep apnea) 01/21/2020   Former smoker 01/02/2020   History of seizure 01/02/2020   Gastroesophageal reflux disease without esophagitis 01/02/2020   Carpal tunnel syndrome 11/25/2016   Peripheral  neuropathy 01/30/2016   Nonepileptic episode (HCC) 01/18/2012   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     Family History  Problem Relation Age of Onset   Stroke Mother    Stroke Father     Social History   Tobacco Use   Smoking status: Former   Smokeless tobacco: Never  Substance Use Topics   Alcohol use: No   Drug use: No    Home Medications Prior to Admission medications   Medication Sig Start Date End Date Taking? Authorizing Provider  albuterol (VENTOLIN HFA) 108 (90 Base) MCG/ACT inhaler Inhale 2 puffs into the lungs every 6 (six) hours as needed for wheezing or shortness of breath. 01/25/20   Roberto Scales D, MD  amLODipine (NORVASC) 5 MG tablet Take 5 mg by mouth daily. 01/25/20   [provider]  atorvastatin (LIPITOR) 20 MG tablet Take 20 mg by mouth daily. 01/02/20   [provider]  benzonatate (TESSALON) 100 MG capsule Take 1 capsule (100 mg total) by mouth every 8 (eight) hours. 05/28/21   Gailen Shelter, PA  chlorpheniramine-HYDROcodone (TUSSIONEX) 10-8 MG/5ML SUER Take 5 mLs by mouth every 12 (twelve) hours as needed (severe cough). 02/17/20   Rai, Delene Ruffini, MD  cholecalciferol (VITAMIN D3) 25 MCG (1000 UT) tablet Take 1,000 Units by mouth daily.    [provider]  Dextromethorphan Polistirex (DELSYM PO) Take 1 tablet by mouth daily as needed (cough).    [provider]  diltiazem (CARDIZEM CD) 120 MG 24 hr capsule Take 120 mg by mouth daily.  01/25/20   [provider]  furosemide (LASIX) 20 MG tablet Take 20 mg by mouth daily.  01/02/20   [provider]  guaiFENesin-dextromethorphan (ROBITUSSIN DM) 100-10  MG/5ML syrup Take 10 mLs by mouth every 4 (four) hours as needed for cough. 01/25/20   Laverna Peace, MD  KLOR-CON M20 20 MEQ tablet Take 20 mEq by mouth daily. 01/02/20   [provider]  LANTUS SOLOSTAR 100 UNIT/ML Solostar Pen Inject 30 Units into the skin at bedtime. 02/17/20   Rai, Delene Ruffini, MD  meclizine (ANTIVERT) 25 MG tablet Take 1 tablet (25 mg total) by mouth 3 (three) times daily as needed for dizziness. 07/20/19   Petrucelli, Samantha R, PA-C  metFORMIN (GLUCOPHAGE) 500 MG tablet Take 500-1,500 mg by mouth See admin instructions. 500mg  in AM and 1500mg  qhs    [provider]  oseltamivir (TAMIFLU) 75 MG capsule Take 1 capsule (75 mg total) by mouth every 12 (twelve) hours. 05/28/21   , PA  polyethylene glycol (MIRALAX / GLYCOLAX) 17 g packet Take 17 g by mouth daily as needed for mild constipation (Also available over-the-counter). 02/17/20   Rai, Gailen Shelter, MD  promethazine-dextromethorphan (PROMETHAZINE-DM) 6.25-15 MG/5ML syrup Take 5 mLs by mouth 4 (four) times daily as needed for cough. 05/28/21   Fondaw,  Rodrigo Ran, PA  rivaroxaban (XARELTO) 20 MG TABS tablet Take 1 tablet (20 mg total) by mouth daily with breakfast. Please start taking after you have completed the starter pack 03/09/20   Rai, Delene Ruffini, MD  Rivaroxaban 15 & 20 MG TBPK Follow package directions: Take one 15mg  tablet by mouth twice a day. On day 22 (03/09/2020) , switch to one 20mg  tablet once a day. Take with food. 02/17/20   Rai, , MD  vitamin C (ASCORBIC ACID) 250 MG tablet Take 250 mg by mouth daily.    [provider]  zinc sulfate 220 (50 Zn) MG capsule Take 1 capsule (220 mg total) by mouth daily. 01/25/20   Delene Ruffini D, MD    Allergies    Aspirin, Demerol, Lisinopril, Losartan, Morphine, Oxycodone, Penicillins, Phentermine, and Trazodone  Review of Systems   Review of Systems  Constitutional:  Positive for chills and fever.  Eyes:  Negative for visual  disturbance.  Respiratory:  Positive for cough and wheezing. Negative for shortness of breath.   Cardiovascular:  Negative for chest pain.  Gastrointestinal:  Positive for nausea and vomiting. Negative for abdominal distention, abdominal pain, anal bleeding, blood in stool, constipation, diarrhea and rectal pain.  Genitourinary:  Negative for difficulty urinating and dysuria.  Musculoskeletal:  Negative for back pain and neck pain.  Skin:  Negative for color change and rash.  Neurological:  Positive for dizziness, numbness (baseline bilateral feet) and headaches. Negative for tremors, seizures, syncope, facial asymmetry, speech difficulty, weakness and light-headedness.  Psychiatric/Behavioral:  Negative for confusion.    Physical Exam Updated Vital Signs BP (!) 227/124 (BP Location: Left Arm)   Pulse 97   Temp 98.5 F (36.9 C) (Oral)   Resp 16   LMP 06/07/2019 (Approximate)   SpO2 99%   Physical Exam Vitals and nursing note reviewed.  Constitutional:      General: She is not in acute distress.    Appearance: She is not ill-appearing, toxic-appearing or diaphoretic.  HENT:     Head: Normocephalic and atraumatic. No raccoon eyes, Battle's sign, abrasion, contusion, masses, right periorbital erythema, left periorbital erythema or laceration.     Jaw: No trismus or pain on movement.     Mouth/Throat:     Pharynx: Oropharynx is clear. Uvula midline. No pharyngeal swelling, oropharyngeal exudate, posterior oropharyngeal erythema or uvula swelling.  Eyes:     General: No scleral icterus.       Right eye: No discharge.        Left eye: No discharge.     Extraocular Movements: Extraocular movements intact.     Conjunctiva/sclera: Conjunctivae normal.     Pupils: Pupils are equal, round, and reactive to light.     Comments: Pupils 4 mm bilaterally  Cardiovascular:     Rate and Rhythm: Normal rate.     Pulses:          Carotid pulses are 2+ on the right side and 2+ on the left side.       Radial pulses are 2+ on the right side and 2+ on the left side.  Pulmonary:     Effort: Pulmonary effort is normal. No tachypnea, bradypnea or respiratory distress.     Breath sounds: Normal breath sounds. No stridor.     Comments: Speaks in full complete sentences without difficulty Abdominal:     Palpations: Abdomen is soft. There is no mass or pulsatile mass.     Tenderness: There is no abdominal tenderness.  There is no guarding or rebound.  Musculoskeletal:     Cervical back: Normal range of motion and neck supple. No edema, erythema, signs of trauma, rigidity, torticollis or crepitus. No pain with movement, spinous process tenderness or muscular tenderness. Normal range of motion.     Right lower leg: No swelling or tenderness. No edema.     Left lower leg: No swelling or tenderness. No edema.     Comments: No midline tenderness or deformity to cervical, thoracic, or lumbar spine  Skin:    General: Skin is warm and dry.  Neurological:     General: No focal deficit present.     Mental Status: She is alert and oriented to person, place, and time.     GCS: GCS eye subscore is 4. GCS verbal subscore is 5. GCS motor subscore is 6.     Cranial Nerves: No cranial nerve deficit or facial asymmetry.     Sensory: Sensation is intact.     Motor: No weakness, tremor, seizure activity or pronator drift.     Coordination: Finger-Nose-Finger Test normal.     Gait: Gait abnormal.     Comments: CN II-XII intact; performed in supine position due to dizziness, +5 strength to bilateral upper extremities, +5 strength to dorsiflexion and plantarflexion, patient able to left both legs against gravity and hold each there without difficulty, sensation to light touch intact to bilateral upper and lower extremities  Psychiatric:        Behavior: Behavior is cooperative.    ED Results / Procedures / Treatments   Labs (all labs ordered are listed, but only abnormal results are displayed) Labs Reviewed   COMPREHENSIVE METABOLIC PANEL - Abnormal; Notable for the following components:      Result Value   Chloride 97 (*)    Glucose, Bld 292 (*)    All other components within normal limits  URINALYSIS, ROUTINE W REFLEX MICROSCOPIC - Abnormal; Notable for the following components:   Color, Urine STRAW (*)    Glucose, UA 50 (*)    Leukocytes,Ua LARGE (*)    Bacteria, UA RARE (*)    All other components within normal limits  CBG MONITORING, ED - Abnormal; Notable for the following components:   Glucose-Capillary 248 (*)    All other components within normal limits  CBC WITH DIFFERENTIAL/PLATELET  LIPASE, BLOOD    EKG None  Radiology DG Chest Portable 1 View  Result Date: 06/05/2021 CLINICAL DATA:  Cough, experiencing vertigo. EXAM: PORTABLE CHEST 1 VIEW COMPARISON:  May 28, 2021. FINDINGS: EKG leads projecting over the chest. Trachea midline. Cardiomediastinal contours and hilar structures are stable. Lungs are clear. No sign of effusion on frontal radiograph. No sign of pneumothorax. On limited assessment no acute skeletal process. IMPRESSION: No active cardiopulmonary disease. Electronically Signed   By: Donzetta Kohut M.D.   On: 06/05/2021 14:45    Procedures Procedures   Medications Ordered in ED Medications - No data to display  ED Course  I have reviewed the triage vital signs and the nursing notes.  Pertinent labs & imaging results that were available during my care of the patient were reviewed by me and considered in my medical decision making (see chart for details).    MDM Rules/Calculators/A&P                          Alert 60 year old female no acute distress, nontoxic-appearing.  Patient presents emerged department chief plaint of dizziness  and headache.  Patient reports history of vertigo and migraines.  Patient reports that these symptoms are similar to previous she has experienced in the past.  I physical exam patient has no focal neurological deficit.  Patient  complains of increased dizziness when moving from semi-Fowler's to seated position.  Patient unsteady on her feet due to dizziness.    Will give patient migraine cocktail.  Lab work obtained.  CMP shows glucose of 292; anion gap and bicarb within normal limits, low suspicion for DKA. CBC is unremarkable. Lipase within normal limits. UA shows no signs of infection or dehydration.  Patient reports improvement in her symptoms after receiving migraine cocktail and scopolamine patch.  Patient able to stand and ambulate without difficulty.  Will discharge patient at this time.  Patient given strict return precautions.  Patient expressed understanding of all instructions and is agreeable with plan.  Patient care and treatment were discussed with attending physician Dr. Anitra LauthPlunkett.  Final Clinical Impression(s) / ED Diagnoses Final diagnoses:  Dizziness  Acute nonintractable headache, unspecified headache type    Rx / DC Orders ED Discharge Orders     None        Berneice HeinrichBadalamente, Sierra Loeffler R, PA-C 06/06/21 0006    Gwyneth SproutPlunkett, [place], MD 06/08/21 2214

## 2021-06-30 ENCOUNTER — Encounter: Payer: Self-pay | Admitting: Dietician

## 2021-06-30 ENCOUNTER — Encounter: Payer: Medicare Other | Attending: Family Medicine | Admitting: Dietician

## 2021-06-30 ENCOUNTER — Other Ambulatory Visit: Payer: Self-pay

## 2021-06-30 DIAGNOSIS — E1165 Type 2 diabetes mellitus with hyperglycemia: Secondary | ICD-10-CM | POA: Insufficient documentation

## 2021-06-30 NOTE — Patient Instructions (Addendum)
Continue to rethink what you drink.  Water is great!    Lean protein or plant protein  Take the skin off the chicken  Bake rather than fry most often  Use the air fryer  Write some sample meal plans that you like Consider using a smaller plate size 1/2 your plate should be non starchy vegetables 1/4 of your plate protein 1/4 plate starch Choose a fruit  Take your medication consistently - Checking your blood sugar gives your doctor more information to treat your blood sugar  Ozempic or other GLP1 medications can be beneficial for weight loss and decreasing appetite

## 2021-06-30 NOTE — Progress Notes (Signed)
Diabetes Self-Management Education  Visit Type: First/Initial  Appt. Start Time: 0805 Appt. End Time: 0935  06/30/2021  [name], identified by name and date of birth, is a 60 y.o. female with a diagnosis of Diabetes: Type 2.   ASSESSMENT Patient is here today alone.  She would like to learn how to better control her blood sugar. She state she wants to know more about when to take her insulin. She wants to know how to better control her blood sugar. She would like to know more about nutrition as well. She reports diarrhea with diarrhea.  She is taking this with food. She states that she is under increased stress due to her daughter having lupus.  She eats due to stress. She craves sweets. She has started to drink smoothies/juicing for breakfast and lunch and a regular meal at night.  History includes Type 2 diabetes, HTN, PE, gastric bypass surgery (about 1997) A1C 11% 08/2020  Medications include:  Metformin (diarrhea), Lantus 30 units q HS, lasix  Weight hx: 281 lbs today 240 lbs 02/15/2020 268 lbs 01/20/2020 Lost from 400 lbs to 170 lbs after gastric bypass surgery  Patient's daughters and 10 mo grandchild live with her.  Sierra Ellis does the shopping and cooking.  She avoids added salt and uses Mrs. Dash.  She cares for her granchild until the first of the year when she will be placed in daycare. She is on disability. She would like to live and have a high quality of life. She likes to shop, walk, vacation, pray to help with stress. She had Covid in 2021 and the flu and is now scared to go out.  Height _0  (1.702 m), weight 281 lb (127.5 kg), last menstrual period 06/07/2019. Body mass index is 44.01 kg/m.   Diabetes Self-Management Education - 06/30/21 0825       Visit Information   Visit Type First/Initial      Initial Visit   Diabetes Type Type 2    Are you currently following a meal plan? No    Are you taking your medications as prescribed? Yes    Date  Diagnosed --   unknown     Health Coping   How would you rate your overall health? Fair      Psychosocial Assessment   Patient Belief/Attitude about Diabetes Denial    Self-care barriers None    Self-management support Doctor's office    Other persons present Patient    Patient Concerns Nutrition/Meal planning    Special Needs None    Preferred Learning Style No preference indicated    Learning Readiness Not Ready    How often do you need to have someone help you when you read instructions, pamphlets, or other written materials from your doctor or pharmacy? 1 - Never    What is the last grade level you completed in school? 10      Pre-Education Assessment   Patient understands the diabetes disease and treatment process. Needs Instruction    Patient understands incorporating nutritional management into lifestyle. Needs Instruction    Patient undertands incorporating physical activity into lifestyle. Needs Instruction    Patient understands using medications safely. Needs Instruction    Patient understands monitoring blood glucose, interpreting and using results Needs Instruction    Patient understands prevention, detection, and treatment of acute complications. Needs Instruction    Patient understands prevention, detection, and treatment of chronic complications. Needs Instruction    Patient understands how to develop strategies  to address psychosocial issues. Needs Instruction    Patient understands how to develop strategies to promote health/change behavior. Needs Instruction      Complications   Last HgB A1C per patient/outside source 11 %   08/2020   How often do you check your blood sugar? 0 times/day (not testing)   fingers swell after checking and hands are sore so stopped   Have you had a dilated eye exam in the past 12 months? Yes    Have you had a dental exam in the past 12 months? Yes    Are you checking your feet? Yes    How many days per week are you checking your feet?  7      Dietary Intake   Breakfast instant oatmeal OR cereal or 2% milk OR boiled egg, toast, sausage/bacon OR pancakes, sausage/bacon OR biscuit and gravy ORsmoothie:  ice, fruit, occasional yogurt, almond milk or oat milk, kale, spinach, banana,    Lunch [place] sandwich OR [place] burger OR flounder sandwich OR salad OR can of chicken noodle soup OR hamburger and fries OR skips    Snack (afternoon) fruit    Dinner fried chicken, mashed potatoes, mac and cheese, spinach OR chicken alfredo, vegetables    Snack (evening) fruit    Beverage(s) water, slightly sweet tea, coffee with regular sweetened creamer or sugar free creamer      Exercise   Exercise Type Light (walking / raking leaves)   caring for granddaughter   How many days per week to you exercise? 1    How many minutes per day do you exercise? 20    Total minutes per week of exercise 20      Patient Education   Previous Diabetes Education No    Disease state  Definition of diabetes, type 1 and 2, and the diagnosis of diabetes    Nutrition management  Role of diet in the treatment of diabetes and the relationship between the three main macronutrients and blood glucose level;Food label reading, portion sizes and measuring food.;Meal options for control of blood glucose level and chronic complications.;Carbohydrate counting;Meal timing in regards to the patients' current diabetes medication.    Physical activity and exercise  Role of exercise on diabetes management, blood pressure control and cardiac health.;Helped patient identify appropriate exercises in relation to his/her diabetes, diabetes complications and other health issue.    Medications Taught/reviewed insulin injection, site rotation, insulin storage and needle disposal.;Reviewed patients medication for diabetes, action, purpose, timing of dose and side effects.    Monitoring Purpose and frequency of SMBG.;Identified appropriate SMBG and/or A1C goals.;Daily foot exams;Yearly  dilated eye exam    Acute complications Taught treatment of hypoglycemia - the 15 rule.    Psychosocial adjustment Worked with patient to identify barriers to care and solutions;Role of stress on diabetes;Identified and addressed patients feelings and concerns about diabetes      Individualized Goals (developed by patient)   Nutrition General guidelines for healthy choices and portions discussed    Physical Activity Exercise 3-5 times per week;15 minutes per day    Medications take my medication as prescribed      Post-Education Assessment   Patient understands the diabetes disease and treatment process. Demonstrates understanding / competency    Patient understands incorporating nutritional management into lifestyle. Needs Review    Patient undertands incorporating physical activity into lifestyle. Needs Review    Patient understands using medications safely. Needs Review    Patient understands monitoring blood glucose, interpreting and  using results Needs Review    Patient understands prevention, detection, and treatment of acute complications. Demonstrates understanding / competency    Patient understands prevention, detection, and treatment of chronic complications. Demonstrates understanding / competency    Patient understands how to develop strategies to address psychosocial issues. Needs Review    Patient understands how to develop strategies to promote health/change behavior. Needs Review      Outcomes   Expected Outcomes Demonstrated interest in learning. Expect positive outcomes    Future DMSE 2 months    Program Status Not Completed             Individualized Plan for Diabetes Self-Management Training:   Learning Objective:  Patient will have a greater understanding of diabetes self-management. Patient education plan is to attend individual and/or group sessions per assessed needs and concerns.   Plan:   Patient Instructions  Continue to rethink what you drink.   Water is great!    Lean protein or plant protein  Take the skin off the chicken  Bake rather than fry most often  Use the air fryer  Write some sample meal plans that you like Consider using a smaller plate size 1/2 your plate should be non starchy vegetables 1/4 of your plate protein 1/4 plate starch Choose a fruit  Take your medication consistently - Checking your blood sugar gives your doctor more information to treat your blood sugar  Ozempic or other GLP1 medications can be beneficial for weight loss and decreasing appetite     Expected Outcomes:  Demonstrated interest in learning. Expect positive outcomes  Education material provided: ADA - How to Thrive: A Guide for Your Journey with Diabetes, Meal plan card, Snack sheet, and Diabetes Resources  If problems or questions, patient to contact team via:  Phone  Future DSME appointment: 2 months

## 2021-07-08 ENCOUNTER — Other Ambulatory Visit: Payer: Self-pay | Admitting: Family Medicine

## 2021-07-08 DIAGNOSIS — Z1231 Encounter for screening mammogram for malignant neoplasm of breast: Secondary | ICD-10-CM

## 2021-07-26 ENCOUNTER — Emergency Department (HOSPITAL_COMMUNITY)
Admission: EM | Admit: 2021-07-26 | Discharge: 2021-07-27 | Disposition: A | Discharge status: Home / Self Care | Payer: Medicare Other | Attending: Emergency Medicine | Admitting: Emergency Medicine

## 2021-07-26 ENCOUNTER — Encounter (HOSPITAL_COMMUNITY): Payer: Self-pay

## 2021-07-26 ENCOUNTER — Emergency Department (HOSPITAL_COMMUNITY): Payer: Medicare Other

## 2021-07-26 DIAGNOSIS — E1140 Type 2 diabetes mellitus with diabetic neuropathy, unspecified: Secondary | ICD-10-CM | POA: Insufficient documentation

## 2021-07-26 DIAGNOSIS — Z87891 Personal history of nicotine dependence: Secondary | ICD-10-CM | POA: Diagnosis not present

## 2021-07-26 DIAGNOSIS — R0602 Shortness of breath: Secondary | ICD-10-CM | POA: Diagnosis not present

## 2021-07-26 DIAGNOSIS — I10 Essential (primary) hypertension: Secondary | ICD-10-CM | POA: Insufficient documentation

## 2021-07-26 DIAGNOSIS — Z7984 Long term (current) use of oral hypoglycemic drugs: Secondary | ICD-10-CM | POA: Insufficient documentation

## 2021-07-26 DIAGNOSIS — R079 Chest pain, unspecified: Secondary | ICD-10-CM | POA: Diagnosis present

## 2021-07-26 DIAGNOSIS — I2510 Atherosclerotic heart disease of native coronary artery without angina pectoris: Secondary | ICD-10-CM | POA: Insufficient documentation

## 2021-07-26 DIAGNOSIS — Z79899 Other long term (current) drug therapy: Secondary | ICD-10-CM | POA: Diagnosis not present

## 2021-07-26 DIAGNOSIS — R0789 Other chest pain: Secondary | ICD-10-CM | POA: Diagnosis not present

## 2021-07-26 DIAGNOSIS — Z7901 Long term (current) use of anticoagulants: Secondary | ICD-10-CM | POA: Insufficient documentation

## 2021-07-26 LAB — CBC
HCT: 41.3 % (ref 36.0–46.0)
Hemoglobin: 12.9 g/dL (ref 12.0–15.0)
MCH: 27.3 pg (ref 26.0–34.0)
MCHC: 31.2 g/dL (ref 30.0–36.0)
MCV: 87.3 fL (ref 80.0–100.0)
Platelets: 321 10*3/uL (ref 150–400)
RBC: 4.73 MIL/uL (ref 3.87–5.11)
RDW: 14.6 % (ref 11.5–15.5)
WBC: 11.1 10*3/uL — ABNORMAL HIGH (ref 4.0–10.5)
nRBC: 0 % (ref 0.0–0.2)

## 2021-07-26 LAB — BASIC METABOLIC PANEL
Anion gap: 11 (ref 5–15)
BUN: 7 mg/dL (ref 6–20)
CO2: 25 mmol/L (ref 22–32)
Calcium: 8.8 mg/dL — ABNORMAL LOW (ref 8.9–10.3)
Chloride: 101 mmol/L (ref 98–111)
Creatinine, Ser: 0.62 mg/dL (ref 0.44–1.00)
GFR, Estimated: >60 mL/min (ref >60)
Glucose, Bld: 217 mg/dL — ABNORMAL HIGH (ref 70–99)
Potassium: 4.1 mmol/L (ref 3.5–5.1)
Sodium: 137 mmol/L (ref 135–145)

## 2021-07-26 LAB — I-STAT BETA HCG BLOOD, ED (MC, WL, AP ONLY): I-stat hCG, quantitative: <5 m[IU]/mL (ref <5)

## 2021-07-26 LAB — TROPONIN I (HIGH SENSITIVITY): Troponin I (High Sensitivity): 13 ng/L (ref <18)

## 2021-07-26 MED ORDER — IOHEXOL 350 MG/ML SOLN
75.0000 mL | Freq: Once | INTRAVENOUS | Status: AC | PRN
  Administered 2021-07-26: 75 mL via INTRAVENOUS

## 2021-07-26 MED ORDER — ACETAMINOPHEN 500 MG PO TABS
500.0000 mg | ORAL_TABLET | Freq: Once | ORAL | Status: AC
  Administered 2021-07-26: 500 mg via ORAL
  Filled 2021-07-26: qty 1

## 2021-07-26 NOTE — ED Provider Notes (Signed)
[facility] Provider Note   CSN: [id number] Arrival date & time: 07/26/21  2050     History Chief Complaint  Patient presents with   Chest Pain    Sierra Ellis is a 60 y.o. female.  Presents to the emergency department with concern for chest pain.  Patient reports that she has been having chest pain since this morning.  Relatively constant, left-sided, radiates across chest.  It is worsened with deep breathing.  May have felt slightly better with nitroglycerin.  Moderate in severity.  Some mild difficulty breathing associated.  Diabetes, hypertension, PE, not on anticoagulation anymore  HPI     Past Medical History:  Diagnosis Date   Abnormal Pap smear of cervix 2011   S/P "freezing it all out" likely colposcopy with cryoablation   Coronary artery disease involving native coronary artery of native heart without angina pectoris 01/21/2020   Diabetes mellitus    Essential hypertension 01/17/2012   Pneumonia due to COVID-19 virus 01/20/2020   Seizures (HCC)    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/17/2012   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    Patient Active Problem List   Diagnosis Date Noted   Acute saddle pulmonary embolism without acute cor pulmonale (HCC) 02/15/2020   Severe obesity (BMI >= 40) (HCC) 01/24/2020   Acute respiratory failure with hypoxia (HCC) 01/24/2020   Hypokalemia 01/21/2020   Coronary artery disease involving native coronary artery of native heart without angina pectoris 01/21/2020   OSA (obstructive sleep apnea) 01/21/2020   Former smoker 01/02/2020   History of seizure 01/02/2020   Gastroesophageal reflux disease without esophagitis 01/02/2020   Carpal tunnel syndrome 11/25/2016   Peripheral neuropathy 01/30/2016   Nonepileptic episode (HCC) 01/18/2012   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     Family History  Problem Relation Age of Onset   Stroke Mother    Stroke Father     Social History   Tobacco Use   Smoking status: Former   Smokeless tobacco: Never  Substance Use Topics   Alcohol use: No   Drug use: No    Home Medications Prior to Admission medications   Medication Sig Start Date End Date Taking? Authorizing Provider  acetaminophen (TYLENOL) 500 MG tablet Take 1,000 mg by mouth every 6 (six) hours as needed for moderate pain.   Yes [provider]  albuterol (VENTOLIN HFA) 108 (90 Base) MCG/ACT inhaler Inhale 2 puffs into the lungs every 6 (six) hours as needed for wheezing or shortness of breath. 01/25/20  Yes Roberto ScalesNettey, Shayla D, MD  amLODipine (NORVASC) 5 MG tablet Take 5 mg by mouth daily. 01/25/20  Yes [provider]  atorvastatin (LIPITOR) 20 MG tablet Take 20 mg by mouth daily. 01/02/20  Yes [provider]  cholecalciferol (VITAMIN D3) 25 MCG (1000 UT) tablet Take 1,000 Units by mouth daily.  Yes [provider]  diclofenac Sodium (VOLTAREN) 1 % GEL Apply 2-4 g topically 4 (four) times daily as needed (pain). 11/29/20  Yes [provider]  diltiazem (CARDIZEM CD) 120 MG 24 hr capsule Take 120 mg by mouth daily.  01/25/20  Yes [provider]  Dulaglutide 0.75 MG/0.5ML SOPN Inject 0.5 mLs into the skin every Monday. 07/17/21 08/16/21 Yes [provider]  furosemide (LASIX) 20 MG tablet Take 20 mg by mouth daily.  01/02/20   Yes [provider]  KLOR-CON M20 20 MEQ tablet Take 20 mEq by mouth daily. 01/02/20  Yes [provider]  LANTUS SOLOSTAR 100 UNIT/ML Solostar Pen Inject 30 Units into the skin at bedtime. 02/17/20  Yes Rai, Ripudeep K, MD  meclizine (ANTIVERT) 25 MG tablet Take 1 tablet (25 mg total) by mouth 3 (three) times daily as needed for dizziness. 07/20/19  Yes Petrucelli, Samantha R, PA-C  metFORMIN (GLUCOPHAGE) 500 MG tablet Take 1,000 mg by mouth in the morning and at bedtime.   Yes [provider]  Naphazoline-Pheniramine (ALLERGY EYE OP) Apply 1 drop to eye daily as needed (red itchy eyes).   Yes [provider]  polyethylene glycol (MIRALAX / GLYCOLAX) 17 g packet Take 17 g by mouth daily as needed for mild constipation (Also available over-the-counter). 02/17/20  Yes Rai, Ripudeep K, MD  vitamin C (ASCORBIC ACID) 250 MG tablet Take 250 mg by mouth daily.   Yes [provider]  benzonatate (TESSALON) 100 MG capsule Take 1 capsule (100 mg total) by mouth every 8 (eight) hours. Patient not taking: No sig reported 05/28/21   Gailen Shelter, PA  chlorpheniramine-HYDROcodone (TUSSIONEX) 10-8 MG/5ML SUER Take 5 mLs by mouth every 12 (twelve) hours as needed (severe cough). Patient not taking: No sig reported 02/17/20   Rai, Ripudeep K, MD  guaiFENesin-dextromethorphan (ROBITUSSIN DM) 100-10 MG/5ML syrup Take 10 mLs by mouth every 4 (four) hours as needed for cough. Patient not taking: No sig reported 01/25/20   Roberto Scales D, MD  oseltamivir (TAMIFLU) 75 MG capsule Take 1 capsule (75 mg total) by mouth every 12 (twelve) hours. Patient not taking: No sig reported 05/28/21   Gailen Shelter, PA  promethazine-dextromethorphan (PROMETHAZINE-DM) 6.25-15 MG/5ML syrup Take 5 mLs by mouth 4 (four) times daily as needed for cough. Patient not taking: No sig reported 05/28/21   Gailen Shelter, PA  rivaroxaban (XARELTO) 20 MG TABS tablet Take 1 tablet (20 mg total) by mouth  daily with breakfast. Please start taking after you have completed the starter pack Patient not taking: No sig reported 03/09/20   Rai, Delene Ruffini, MD  Rivaroxaban 15 & 20 MG TBPK Follow package directions: Take one 15mg  tablet by mouth twice a day. On day 22 (03/09/2020) , switch to one 20mg  tablet once a day. Take with food. Patient not taking: No sig reported 02/17/20   Rai, Ripudeep K, MD  zinc sulfate 220 (50 Zn) MG capsule Take 1 capsule (220 mg total) by mouth daily. Patient not taking: Reported on 07/26/2021 01/25/20   09/25/2021 D, MD    Allergies    Aspirin, Demerol, Lisinopril, Losartan, Morphine, Oxycodone, Penicillins, Phentermine, and Trazodone  Review of Systems   Review of Systems  Constitutional:  Negative for chills and fever.  HENT:  Negative for ear pain and sore throat.   Eyes:  Negative for pain and visual disturbance.  Respiratory:  Positive for chest tightness. Negative for cough and shortness of breath.  Cardiovascular:  Positive for chest pain. Negative for palpitations.  Gastrointestinal:  Negative for abdominal pain and vomiting.  Genitourinary:  Negative for dysuria and hematuria.  Musculoskeletal:  Negative for arthralgias and back pain.  Skin:  Negative for color change and rash.  Neurological:  Negative for seizures and syncope.  All other systems reviewed and are negative.  Physical Exam Updated Vital Signs BP (!) 163/66   Pulse (!) 50   Temp 98.3 F (36.8 C) (Oral)   Resp (!) 23   LMP 06/07/2019 (Approximate)   SpO2 97%   Physical Exam Vitals and nursing note reviewed.  Constitutional:      General: She is not in acute distress.    Appearance: She is well-developed.  HENT:     Head: Normocephalic and atraumatic.  Eyes:     Conjunctiva/sclera: Conjunctivae normal.  Cardiovascular:     Rate and Rhythm: Normal rate and regular rhythm.     Heart sounds: No murmur heard. Pulmonary:     Effort: Pulmonary effort is normal. No respiratory  distress.     Breath sounds: Normal breath sounds.  Chest:     Comments: Some tenderness across anterior chest Abdominal:     Palpations: Abdomen is soft.     Tenderness: There is no abdominal tenderness.  Musculoskeletal:     Cervical back: Neck supple.  Skin:    General: Skin is warm and dry.  Neurological:     Mental Status: She is alert.    ED Results / Procedures / Treatments   Labs (all labs ordered are listed, but only abnormal results are displayed) Labs Reviewed  BASIC METABOLIC PANEL - Abnormal; Notable for the following components:      Result Value   Glucose, Bld 217 (*)    Calcium 8.8 (*)    All other components within normal limits  CBC - Abnormal; Notable for the following components:   WBC 11.1 (*)    All other components within normal limits  TROPONIN I (HIGH SENSITIVITY) - Abnormal; Notable for the following components:   Troponin I (High Sensitivity) 18 (*)    All other components within normal limits  BRAIN NATRIURETIC PEPTIDE  I-STAT BETA HCG BLOOD, ED ([facility], [facility], [facility] ONLY)  TROPONIN I (HIGH SENSITIVITY)    EKG EKG Interpretation  Date/Time:  Saturday July 26 2021 21:02:04 EDT Ventricular Rate:  97 PR Interval:  161 QRS Duration: 96 QT Interval:  381 QTC Calculation: 484 R Axis:   36 Text Interpretation: Sinus rhythm Multiple ventricular premature complexes Confirmed by Kennis Carina [phone]) on 07/27/2021 1:41:04 AM  Radiology DG Chest 2 View  Result Date: 07/26/2021 CLINICAL DATA:  Chest pain EXAM: CHEST - 2 VIEW COMPARISON:  06/05/2021 FINDINGS: Lungs are well expanded, symmetric, and clear. No pneumothorax or pleural effusion. Cardiac size within normal limits. Pulmonary vascularity is normal. Osseous structures are age-appropriate. No acute bone abnormality. IMPRESSION: No active cardiopulmonary disease. Electronically Signed   By: Helyn Numbers M.D.   On: 07/26/2021 21:25   CT Angio Chest PE W and/or Wo Contrast  Result Date:  07/26/2021 CLINICAL DATA:  Chest pain or SOB, pleurisy or effusion suspected PE suspected, high prob hx saddle PE, now with CP and SOB, off AC; eval for PE EXAM: CT ANGIOGRAPHY CHEST WITH CONTRAST TECHNIQUE: Multidetector CT imaging of the chest was performed using the standard protocol during bolus administration of intravenous contrast. Multiplanar CT image reconstructions and MIPs were obtained to evaluate the vascular anatomy. CONTRAST:  40mL  OMNIPAQUE IOHEXOL 350 MG/ML SOLN COMPARISON:  Radiograph earlier today.  Chest CTA 02/15/2020 FINDINGS: Cardiovascular: The previous bilateral pulmonary emboli have resolved. There is no evidence of chronic, recurrent, or acute pulmonary embolus. The heart is normal in size. Mild aortic atherosclerosis without dissection. No pericardial effusion. Mediastinum/Nodes: No mediastinal or hilar adenopathy. No esophageal wall thickening. Scattered small bilateral axillary nodes which are not enlarged by size criteria. No visualized thyroid nodule. Lungs/Pleura: Heterogeneous pulmonary parenchyma with mild mosaic attenuation again seen. There is mild central bronchial thickening. Scattered areas of subsegmental atelectasis. No confluent consolidation. No pleural fluid. Upper Abdomen: Gastric bypass anatomy. No acute upper abdominal findings. Musculoskeletal: There are no acute or suspicious osseous abnormalities. Thoracic spondylosis with endplate spurring. Review of the MIP images confirms the above findings. IMPRESSION: 1. No pulmonary embolus. The previous bilateral pulmonary emboli have resolved. 2. Heterogeneous pulmonary parenchyma with mild mosaic attenuation, can be seen with small airways or small vessel disease. 3. Mild central bronchial thickening. Aortic Atherosclerosis (ICD10-I70.0). Electronically Signed   By: Narda Rutherford M.D.   On: 07/26/2021 22:59    Procedures Procedures   Medications Ordered in ED Medications  acetaminophen (TYLENOL) tablet 500 mg (500  mg Oral Given 07/26/21 2151)  iohexol (OMNIPAQUE) 350 MG/ML injection 75 mL (75 mLs Intravenous Contrast Given 07/26/21 2223)    ED Course  I have reviewed the triage vital signs and the nursing notes.  Pertinent labs & imaging results that were available during my care of the patient were reviewed by me and considered in my medical decision making (see chart for details).    MDM Rules/Calculators/A&P                           60 year old lady presented to ER with concern for chest pain.  On exam patient well-appearing in no distress.  Vitals grossly stable.  EKG without acute ischemic change and initial troponin normal.  She has history of pulmonary embolism but is no longer anticoagulated.  Check CTA to evaluate for PE.  No evidence for pulmonary embolus.  Given age, risk factors, will observe patient further and check repeat troponin.  While awaiting reassessment and repeat troponin, signed out to Dr. Pilar Plate.  If patient does not have ongoing pain and she remains well-appearing repeat troponin is normal, anticipate she likely can be discharged home.  Final Clinical Impression(s) / ED Diagnoses Final diagnoses:  Chest pain, unspecified type    Rx / DC Orders ED Discharge Orders     None        Milagros Loll, MD 07/27/21 1448

## 2021-07-26 NOTE — ED Triage Notes (Signed)
Pt comes via [facility] for CP that has been going on since AM, getting worse, HR originally 140, now in around 115, given 4 nitro and 324 ASA PTA.

## 2021-07-26 NOTE — ED Provider Notes (Signed)
  Provider Note MRN:  [id number]  Arrival date & time: 07/27/21    ED Course and Medical Decision Making  Assumed care from Dr. Stevie Kern at shift change.  Chest pain, history of PE, CTA is negative, awaiting second troponin.  Anticipating discharge if flat.  On reassessment patient is feeling a lot better.  She notices that her chest pain is largely gone but certain positions and movements can bring her back.  This seems more suspicious for a musculoskeletal etiology.  The repeat troponin is 18, minimally elevated and basically unchanged if you consider the standard deviation/error of this high-sensitivity test.  Still, patient was informed of this result, she is comfortable with outpatient follow-up.  Procedures  Final Clinical Impressions(s) / ED Diagnoses     ICD-10-CM   1. Chest pain, unspecified type  R07.9       ED Discharge Orders     None         Discharge Instructions      You were evaluated in the Emergency Department and after careful evaluation, we did not find any emergent condition requiring admission or further testing in the hospital.  Your exam/testing today is overall reassuring.  Recommend follow-up with your primary care doctor within the next 2 weeks to discuss your symptoms.  You may benefit from an outpatient stress test to get a closer look at your heart.  Please return to the Emergency Department if you experience any worsening of your condition.   Thank you for allowing [place] to be a part of your care.      Elmer Sow. Pilar Plate, MD [facility] Emergency Medicine [facility] [email or web address]    Sabas Sous, MD 07/27/21 [id number]

## 2021-07-26 NOTE — ED Notes (Signed)
Pt to xray via stretcher by tech

## 2021-07-26 NOTE — ED Notes (Signed)
Sister Eber Jones [phone] would like an update

## 2021-07-26 NOTE — ED Notes (Signed)
Pt returned from x-ray, Dr. Stevie Kern at bedside.

## 2021-07-27 LAB — BRAIN NATRIURETIC PEPTIDE: B Natriuretic Peptide: 12.5 pg/mL (ref 0.0–100.0)

## 2021-07-27 LAB — TROPONIN I (HIGH SENSITIVITY): Troponin I (High Sensitivity): 18 ng/L — ABNORMAL HIGH (ref <18)

## 2021-07-27 NOTE — Discharge Instructions (Addendum)
You were evaluated in the Emergency Department and after careful evaluation, we did not find any emergent condition requiring admission or further testing in the hospital.  Your exam/testing today is overall reassuring.  Recommend follow-up with your primary care doctor within the next 2 weeks to discuss your symptoms.  You may benefit from an outpatient stress test to get a closer look at your heart.  Please return to the Emergency Department if you experience any worsening of your condition.   Thank you for allowing [place] to be a part of your care.

## 2021-08-25 ENCOUNTER — Ambulatory Visit: Payer: Medicare Other | Admitting: Dietician

## 2021-09-02 ENCOUNTER — Ambulatory Visit: Payer: Medicare Other

## 2022-03-25 IMAGING — DX DG CHEST 1V PORT
1 series · 1 of 1 positions shown · non-contrast
Comparison: May 28, 2021.

CLINICAL DATA: Cough, experiencing vertigo.

EXAM:
PORTABLE CHEST 1 VIEW

[chest ap]
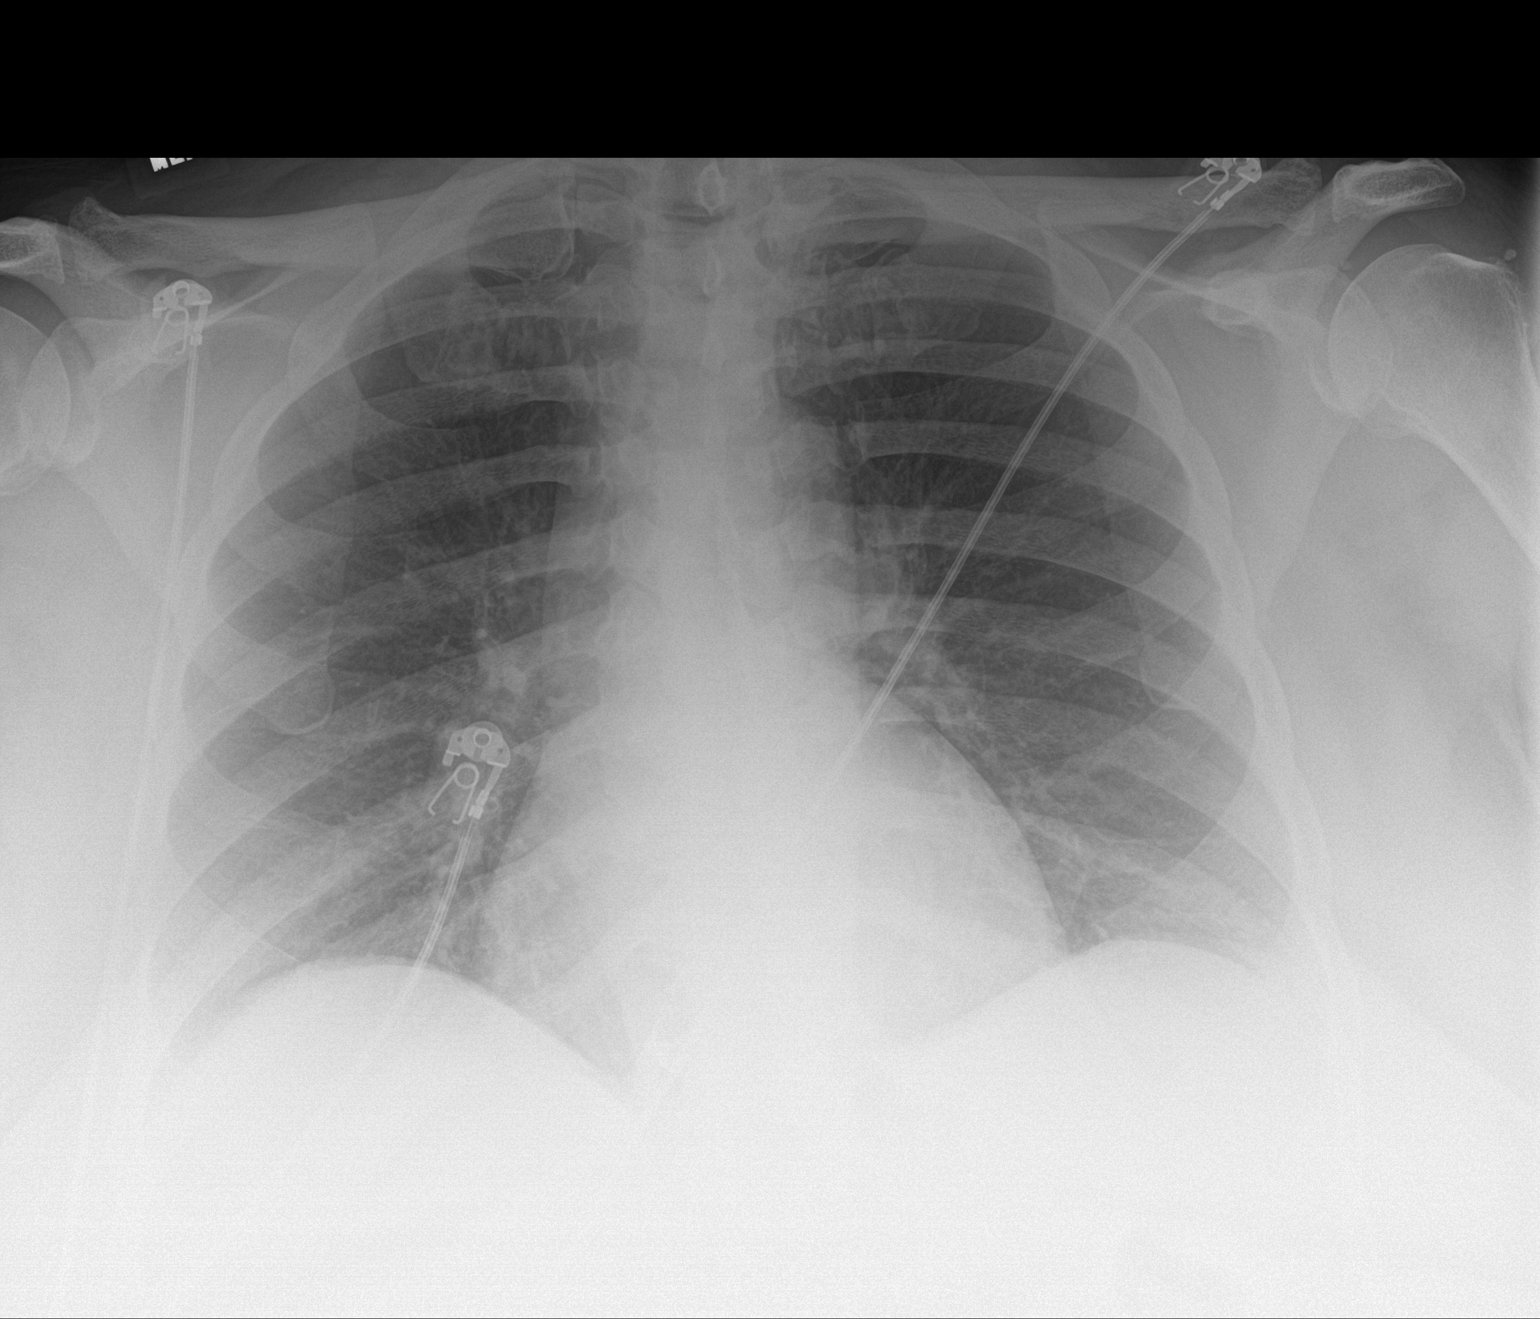

[1 of 1 positions shown; findings below may reference images not displayed]

FINDINGS: EKG leads projecting over the chest.

Trachea midline.

Cardiomediastinal contours and hilar structures are stable.

Lungs are clear. No sign of effusion on frontal radiograph. No sign
of pneumothorax.

On limited assessment no acute skeletal process.
IMPRESSION: No active cardiopulmonary disease.

## 2022-09-22 ENCOUNTER — Ambulatory Visit
Admission: EM | Admit: 2022-09-22 | Discharge: 2022-09-22 | Disposition: A | Discharge status: Home / Self Care | Payer: Medicare Other | Attending: Physician Assistant | Admitting: Physician Assistant

## 2022-09-22 ENCOUNTER — Ambulatory Visit (INDEPENDENT_AMBULATORY_CARE_PROVIDER_SITE_OTHER): Payer: Medicare Other

## 2022-09-22 DIAGNOSIS — J069 Acute upper respiratory infection, unspecified: Secondary | ICD-10-CM | POA: Diagnosis present

## 2022-09-22 DIAGNOSIS — Z20828 Contact with and (suspected) exposure to other viral communicable diseases: Secondary | ICD-10-CM

## 2022-09-22 DIAGNOSIS — R059 Cough, unspecified: Secondary | ICD-10-CM | POA: Diagnosis not present

## 2022-09-22 DIAGNOSIS — Z1152 Encounter for screening for COVID-19: Secondary | ICD-10-CM | POA: Diagnosis not present

## 2022-09-22 DIAGNOSIS — R062 Wheezing: Secondary | ICD-10-CM | POA: Diagnosis not present

## 2022-09-22 DIAGNOSIS — J4541 Moderate persistent asthma with (acute) exacerbation: Secondary | ICD-10-CM | POA: Diagnosis not present

## 2022-09-22 LAB — RESP PANEL BY RT-PCR (RSV, FLU A&B, COVID)  RVPGX2
Influenza A by PCR: NEGATIVE
Influenza B by PCR: NEGATIVE
Resp Syncytial Virus by PCR: POSITIVE — AB
SARS Coronavirus 2 by RT PCR: NEGATIVE

## 2022-09-22 MED ORDER — DOXYCYCLINE HYCLATE 100 MG PO CAPS: 100.0000 mg | ORAL_CAPSULE | Freq: Two times a day (BID) | ORAL | 0 refills | Status: AC

## 2022-09-22 MED ORDER — ALBUTEROL SULFATE (2.5 MG/3ML) 0.083% IN NEBU
2.5000 mg | INHALATION_SOLUTION | Freq: Once | RESPIRATORY_TRACT | Status: AC
Start: 2022-09-22 — End: 2022-09-22
  Administered 2022-09-22: 2.5 mg via RESPIRATORY_TRACT

## 2022-09-22 MED ORDER — SEREVENT DISKUS 50 MCG/ACT IN AEPB: 1.0000 | INHALATION_SPRAY | Freq: Two times a day (BID) | RESPIRATORY_TRACT | 12 refills | Status: AC

## 2022-09-22 MED ORDER — PROMETHAZINE-DM 6.25-15 MG/5ML PO SYRP: 5.0000 mL | ORAL_SOLUTION | Freq: Four times a day (QID) | ORAL | 0 refills | Status: AC | PRN

## 2022-09-22 NOTE — Discharge Instructions (Addendum)
Lab test for COVID/flu/RSV to be completed in 48 hours.  If you do not get a call from this office that indicates the test is negative.  Log onto MyChart to view the test results when it post in 48 hours. Advised to use this every event Diskus, 1 puff twice daily to help decrease wheezing and shortness of breath. Advised to continue to use the albuterol inhaler, 2 puffs every 6 hours on a regular basis to help decrease acute wheezing and shortness of breath. Advised to take Promethazine DM, 1 to 2 teaspoons every 6-8 hours as needed for cough and congestion. Advised take the doxycycline 100 mg every 12 hours to treat respiratory infection.  Advised that if the present medication combinations do not improve the wheezing and shortness of breath or if it gets worse over the next 48 to 72 hours then it is advised that she report to the emergency room for evaluation.

## 2022-09-22 NOTE — ED Triage Notes (Signed)
Pt presents to [facility] with co of cough, congestion, sob and wheezing for 2 weeks. Pt reports cough medication and tylenol otc for symptoms. Grandchild had RSV

## 2022-09-22 NOTE — ED Provider Notes (Signed)
[facility]    CSN: [id number] Arrival date & time: 09/22/22  4235      History   Chief Complaint Chief Complaint  Patient presents with   Cough    HPI Sierra Ellis is a 61 y.o. female.   61 year old female presents with shortness of breath and wheezing.  Patient indicates that she got her flu vaccine 2 weeks ago and then started getting sick.  She indicates she is also taking care of her granddaughter who tested positive for RSV and has been around her for the past week.  Patient indicates she does have history of asthma.  For the past 2 weeks she has been having progressive cough, chest congestion with purulent production, wheezing and shortness of breath that has worsened over the past several days.  She also indicates she has had some upper respiratory congestion with rhinitis and postnasal drip.  Patient indicates she has had mild fatigue along with her symptoms.  She indicates she has not had any fever or chills.  She relates she has been using her albuterol inhaler on a regular basis but it is not improving her shortness of breath and wheezing.  She indicates last night when she was sleeping she heard a loud noise and she woke up and it was herself breathing and wheezing.  Patient indicates she is tolerating fluids well but she is concerned with her respiratory symptoms since she has been exposed to RSV.  Patient indicates that she cannot tolerate prednisone/steroids as it breaks her out and makes her itch severely when she takes that particular type of medication.   Cough Associated symptoms: shortness of breath and wheezing     Past Medical History:  Diagnosis Date   Abnormal Pap smear of cervix 2011   S/P "freezing it all out" likely colposcopy with cryoablation   Coronary artery disease involving native coronary artery of native heart without angina pectoris 01/21/2020   Diabetes mellitus    Essential hypertension 01/17/2012   Pneumonia due to COVID-19  virus 01/20/2020   Seizures (HCC)    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/17/2012   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    Patient Active Problem List   Diagnosis Date Noted   Acute saddle pulmonary embolism without acute cor pulmonale (HCC) 02/15/2020   Severe obesity (BMI >= 40) (HCC) 01/24/2020   Acute respiratory failure with hypoxia (HCC) 01/24/2020   Hypokalemia 01/21/2020   Coronary artery disease involving native coronary artery of native heart without angina pectoris 01/21/2020   OSA (obstructive sleep apnea) 01/21/2020   Former smoker 01/02/2020   History of seizure 01/02/2020   Gastroesophageal reflux disease without esophagitis 01/02/2020   Carpal tunnel syndrome 11/25/2016   Peripheral neuropathy 01/30/2016   Nonepileptic episode (HCC) 01/18/2012   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      Home Medications    Prior to Admission medications  Medication Sig Start Date End Date Taking? Authorizing Provider  doxycycline (VIBRAMYCIN) 100 MG capsule Take 1 capsule (100 mg total) by mouth 2 (two) times daily. 09/22/22  Yes Ellsworth Lennox, PA-C  salmeterol (SEREVENT DISKUS) 50 MCG/ACT diskus inhaler Inhale 1 puff into the lungs 2 (two) times daily. 09/22/22  Yes Ellsworth Lennox, PA-C  acetaminophen (TYLENOL) 500 MG tablet Take 1,000 mg by mouth every 6 (six) hours as needed for moderate pain.     [provider]  albuterol (VENTOLIN HFA) 108 (90 Base) MCG/ACT inhaler Inhale 2 puffs into the lungs every 6 (six) hours as needed for wheezing or shortness of breath. 01/25/20   Roberto Scales D, MD  amLODipine (NORVASC) 5 MG tablet Take 5 mg by mouth daily. 01/25/20   [provider]  atorvastatin (LIPITOR) 20 MG tablet Take 20 mg by mouth daily. 01/02/20   [provider]  benzonatate (TESSALON) 100 MG capsule Take 1 capsule (100 mg total) by mouth every 8 (eight) hours. Patient not taking: No sig reported 05/28/21   Gailen Shelter, PA  chlorpheniramine-HYDROcodone (TUSSIONEX) 10-8 MG/5ML SUER Take 5 mLs by mouth every 12 (twelve) hours as needed (severe cough). Patient not taking: No sig reported 02/17/20   Rai, Delene Ruffini, MD  cholecalciferol (VITAMIN D3) 25 MCG (1000 UT) tablet Take 1,000 Units by mouth daily.    [provider]  diclofenac Sodium (VOLTAREN) 1 % GEL Apply 2-4 g topically 4 (four) times daily as needed (pain). 11/29/20   [provider]  diltiazem (CARDIZEM CD) 120 MG 24 hr capsule Take 120 mg by mouth daily.  01/25/20   [provider]  furosemide (LASIX) 20 MG tablet Take 20 mg by mouth daily.  01/02/20   [provider]  guaiFENesin-dextromethorphan (ROBITUSSIN DM) 100-10 MG/5ML syrup Take 10 mLs by mouth every 4 (four) hours as needed for cough. Patient not taking: No sig reported 01/25/20   Roberto Scales D, MD  KLOR-CON M20 20 MEQ tablet Take 20 mEq by mouth daily. 01/02/20   [provider]  LANTUS SOLOSTAR 100 UNIT/ML Solostar Pen Inject 30 Units into the skin at bedtime. 02/17/20   Rai, Delene Ruffini, MD  meclizine (ANTIVERT) 25 MG tablet Take 1 tablet (25 mg total) by mouth 3 (three) times daily as needed for dizziness. 07/20/19   Petrucelli, Samantha R, PA-C  metFORMIN (GLUCOPHAGE) 500 MG tablet Take 1,000 mg by mouth in the morning and at bedtime.    [provider]  Naphazoline-Pheniramine (ALLERGY EYE  OP) Apply 1 drop to eye daily as needed (red itchy eyes).    [provider]  oseltamivir (TAMIFLU) 75 MG capsule Take 1 capsule (75 mg total) by mouth every 12 (twelve) hours. Patient not taking: No sig reported 05/28/21   Gailen Shelter, PA  polyethylene glycol (MIRALAX / GLYCOLAX) 17 g packet Take 17 g by mouth daily as needed for mild constipation (Also available over-the-counter). 02/17/20   Rai, Delene Ruffini, MD  promethazine-dextromethorphan (PROMETHAZINE-DM) 6.25-15 MG/5ML syrup Take 5 mLs by mouth 4 (four) times daily as needed for cough. 09/22/22   Ellsworth Lennox, PA-C  rivaroxaban (XARELTO) 20 MG TABS tablet Take 1 tablet (20 mg total) by mouth daily with breakfast. Please start taking after you have completed the starter pack Patient not taking: No sig reported 03/09/20   Rai, Delene Ruffini, MD  Rivaroxaban 15 & 20 MG TBPK Follow package directions: Take one 15mg  tablet by mouth twice a day. On day 22 (  03/09/2020) , switch to one 20mg  tablet once a day. Take with food. Patient not taking: No sig reported 02/17/20   Rai, Delene Ruffiniipudeep K, MD  vitamin C (ASCORBIC ACID) 250 MG tablet Take 250 mg by mouth daily.    [provider]  zinc sulfate 220 (50 Zn) MG capsule Take 1 capsule (220 mg total) by mouth daily. Patient not taking: Reported on 07/26/2021 01/25/20   Laverna PeaceNettey, Shayla D, MD    Family History Family History  Problem Relation Age of Onset   Stroke Mother    Stroke Father     Social History Social History   Tobacco Use   Smoking status: Former   Smokeless tobacco: Never  Substance Use Topics   Alcohol use: No   Drug use: No     Allergies   Aspirin, Demerol, Lisinopril, Losartan, Morphine, Oxycodone, Penicillins, Phentermine, and Trazodone   Review of Systems Review of Systems  Respiratory:  Positive for cough, shortness of breath and wheezing.      Physical Exam Triage Vital Signs ED Triage Vitals  Enc Vitals Group     BP 09/22/22 0855 (!) 164/88      Pulse Rate 09/22/22 0855 97     Resp 09/22/22 0855 20     Temp 09/22/22 0855 98 F (36.7 C)     Temp Source 09/22/22 0855 Oral     SpO2 09/22/22 0855 (!) 88 %     Weight --      Height --      Head Circumference --      Peak Flow --      Pain Score 09/22/22 0858 0     Pain Loc --      Pain Edu? --      Excl. in GC? --    No data found.  Updated Vital Signs BP (!) 164/88 (BP Location: Left Arm)   Pulse 97   Temp 98 F (36.7 C) (Oral)   Resp 20   LMP 06/07/2019 (Approximate)   SpO2 93%   Visual Acuity Right Eye Distance:   Left Eye Distance:   Bilateral Distance:    Right Eye Near:   Left Eye Near:    Bilateral Near:     Physical Exam Constitutional:      Appearance: Normal appearance.  HENT:     Right Ear: Ear canal normal. Tympanic membrane is injected.     Left Ear: Ear canal normal. Tympanic membrane is injected.     Mouth/Throat:     Mouth: Mucous membranes are moist.     Pharynx: Oropharynx is clear. No posterior oropharyngeal erythema.  Cardiovascular:     Rate and Rhythm: Normal rate and regular rhythm.     Heart sounds: Normal heart sounds.  Pulmonary:     Effort: Pulmonary effort is normal.     Breath sounds: Normal air entry. Examination of the right-lower field reveals wheezing. Examination of the left-lower field reveals wheezing. Wheezing (moderate) and rhonchi (mild) present. No rales.  Lymphadenopathy:     Cervical: No cervical adenopathy.  Neurological:     Mental Status: She is alert.      UC Treatments / Results  Labs (all labs ordered are listed, but only abnormal results are displayed) Labs Reviewed  RESP PANEL BY RT-PCR (RSV, FLU A&B, COVID)  [id number]    EKG   Radiology DG Chest 2 View  Result Date: 09/22/2022 CLINICAL DATA:  Provided history: Cough for 2 weeks, congestion, history of asthma, wheezing,  RSV exposure. EXAM: CHEST - 2 VIEW COMPARISON:  CT angiogram chest 07/26/2021. Prior chest radiographs 07/26/2021 and earlier.  FINDINGS: Heart size within normal limits. Small foci of linear atelectasis within bilateral lung bases. No appreciable airspace consolidation. No evidence of pleural effusion or pneumothorax. Degenerative changes of the spine. IMPRESSION: Small foci of linear atelectasis within bilateral lung bases. No evidence of airspace consolidation. Electronically Signed   By: Kellie Simmering D.O.   On: 09/22/2022 09:45    Procedures Procedures (including critical care time)  Medications Ordered in UC Medications  albuterol (PROVENTIL) (2.5 MG/3ML) 0.083% nebulizer solution 2.5 mg (has no administration in time range)    Initial Impression / Assessment and Plan / UC Course  I have reviewed the triage vital signs and the nursing notes.  Pertinent labs & imaging results that were available during my care of the patient were reviewed by me and considered in my medical decision making (see chart for details).    Plan: 1.  The acute exacerbation of asthma will be treated with the following: A.  Albuterol nebulizer treatment 0.083% given in the office. B.  Advised patient to start Serevent discus 50 mcg, every 12 hours to help decrease wheezing or shortness of breath. C.  Advised to continue using the albuterol inhaler, 2 puffs every 6 hours on a regular basis to help decrease wheezing and shortness of breath. D.  Doxycycline 100 mg every 12 hours to treat respiratory infection. 2.  The upper respiratory tract infection will be treated with the following: A.  Phenergan DM cough syrup, 1 to 2 teaspoons every 6-8 hours needed for cough and congestion. 3.  The RSV exposure will be treated with the following: A.  Screening for RSV is pending and treatment will be modified depending on test results. 4.  Greening for COVID-19 will be treated with the following: A.  Treatment will be modified depending on the results of the COVID and flu test. 5.  Patient advised to follow-up with PCP or if symptoms fail to improve  over the next 2 to 3 days she is advised to report to the emergency room for evaluation. Final Clinical Impressions(s) / UC Diagnoses   Final diagnoses:  Acute upper respiratory infection  Moderate persistent asthma with acute exacerbation  RSV exposure  Encounter for screening for COVID-19     Discharge Instructions      Lab test for COVID/flu/RSV to be completed in 48 hours.  If you do not get a call from this office that indicates the test is negative.  Log onto MyChart to view the test results when it post in 48 hours. Advised to use this every event Diskus, 1 puff twice daily to help decrease wheezing and shortness of breath. Advised to continue to use the albuterol inhaler, 2 puffs every 6 hours on a regular basis to help decrease acute wheezing and shortness of breath. Advised to take Promethazine DM, 1 to 2 teaspoons every 6-8 hours as needed for cough and congestion. Advised take the doxycycline 100 mg every 12 hours to treat respiratory infection.  Advised that if the present medication combinations do not improve the wheezing and shortness of breath or if it gets worse over the next 48 to 72 hours then it is advised that she report to the emergency room for evaluation.    ED Prescriptions     Medication Sig Dispense Auth. Provider   promethazine-dextromethorphan (PROMETHAZINE-DM) 6.25-15 MG/5ML syrup Take 5 mLs by mouth 4 (four) times  daily as needed for cough. 118 mL Ellsworth Lennox, PA-C   salmeterol (SEREVENT DISKUS) 50 MCG/ACT diskus inhaler Inhale 1 puff into the lungs 2 (two) times daily. 1 each Ellsworth Lennox, PA-C   doxycycline (VIBRAMYCIN) 100 MG capsule Take 1 capsule (100 mg total) by mouth 2 (two) times daily. 20 capsule Ellsworth Lennox, PA-C      PDMP not reviewed this encounter.   Ellsworth Lennox, PA-C 09/22/22 1000
